# Patient Record
Sex: Female | Born: 1985 | Race: White | Hispanic: No | Marital: Single | Smoking: Never smoker
Health system: Southern US, Community
[De-identification: ages and names within clinical notes are randomized; demographics above are authoritative.]

## PROBLEM LIST (undated history)

## (undated) DIAGNOSIS — Z789 Other specified health status: Secondary | ICD-10-CM

## (undated) HISTORY — PX: NO PAST SURGERIES: SHX2092

---

## 2012-06-26 ENCOUNTER — Emergency Department (HOSPITAL_COMMUNITY)
Admission: EM | Admit: 2012-06-26 | Discharge: 2012-06-26 | Disposition: A | Discharge status: Home / Self Care | Payer: Self-pay | Attending: Emergency Medicine | Admitting: Emergency Medicine

## 2012-06-26 ENCOUNTER — Encounter (HOSPITAL_COMMUNITY): Payer: Self-pay | Admitting: Emergency Medicine

## 2012-06-26 DIAGNOSIS — R338 Other retention of urine: Secondary | ICD-10-CM | POA: Insufficient documentation

## 2012-06-26 DIAGNOSIS — Z87891 Personal history of nicotine dependence: Secondary | ICD-10-CM | POA: Insufficient documentation

## 2012-06-26 LAB — URINALYSIS, MICROSCOPIC ONLY
Leukocytes, UA: NEGATIVE
Nitrite: NEGATIVE
Specific Gravity, Urine: 1.021 (ref 1.005–1.030)
pH: 7 (ref 5.0–8.0)

## 2012-06-26 NOTE — ED Notes (Signed)
Pt here for unable to void x8.5 hr after having sex

## 2012-06-26 NOTE — ED Provider Notes (Signed)
History     CSN: [id number]  Arrival date & time 06/26/12  1754   First MD Initiated Contact with Patient 06/26/12 2108      Chief Complaint  Patient presents with  . Dysuria    (Consider location/radiation/quality/duration/timing/severity/associated sxs/prior treatment) Patient is a 27 y.o. female presenting with dysuria. The history is provided by the patient.  Dysuria   She had sexual intercourse this morning. Following that, she has been unable to void. The last time she was able to urinate was approximately 10 AM. She has had the urge to urinate but has not been able to do so. She denies abdominal pain or flank pain. She denies fever or chills. She's not had urinary problems before this.  History reviewed. No pertinent past medical history.  History reviewed. No pertinent past surgical history.  No family history on file.  History  Substance Use Topics  . Smoking status: Former Games developer  . Smokeless tobacco: Not on file  . Alcohol Use: No    OB History   Grav Para Term Preterm Abortions TAB SAB Ect Mult Living                  Review of Systems  Genitourinary: Positive for dysuria.  All other systems reviewed and are negative.    Allergies  Review of patient's allergies indicates not on file.  Home Medications  No current outpatient prescriptions on file.  BP 159/96  Pulse 74  Temp(Src) 98.4 F (36.9 C)  SpO2 100%  LMP 06/24/2012  Physical Exam  Nursing note and vitals reviewed.  27 year old female, resting comfortably and in no acute distress. Vital signs are significant for hypertension with blood pressure 159/96. Oxygen saturation is 100%, which is normal. Head is normocephalic and atraumatic. PERRLA, EOMI. Oropharynx is clear. Neck is nontender and supple without adenopathy or JVD. Back is nontender and there is no CVA tenderness. Lungs are clear without rales, wheezes, or rhonchi. Chest is nontender. Heart has regular rate and rhythm without  murmur. Abdomen is soft, flat, nontender without hepatosplenomegaly and peristalsis is normoactive. Bladder is moderately distended and nontender, although, she states it is uncomfortable when pressing on the bladder. Extremities have no cyanosis or edema, full range of motion is present. Skin is warm and dry without rash. Neurologic: Mental status is normal, cranial nerves are intact, there are no motor or sensory deficits.  ED Course  Procedures (including critical care time)  Results for orders placed during the hospital encounter of 06/26/12  URINALYSIS, MICROSCOPIC ONLY      Result Value Range   Color, Urine YELLOW  YELLOW   APPearance CLEAR  CLEAR   Specific Gravity, Urine 1.021  1.005 - 1.030   pH 7.0  5.0 - 8.0   Glucose, UA NEGATIVE  NEGATIVE mg/dL   Hgb urine dipstick NEGATIVE  NEGATIVE   Bilirubin Urine NEGATIVE  NEGATIVE   Ketones, ur NEGATIVE  NEGATIVE mg/dL   Protein, ur NEGATIVE  NEGATIVE mg/dL   Urobilinogen, UA 0.2  0.0 - 1.0 mg/dL   Nitrite NEGATIVE  NEGATIVE   Leukocytes, UA NEGATIVE  NEGATIVE   WBC, UA 0-2  <3 WBC/hpf   Squamous Epithelial / LPF RARE  RARE   Urine-Other AMORPHOUS URATES/PHOSPHATES      1. Acute urinary retention       MDM  Acute urinary retention. Bladder scan showed 540 mL in the bladder. She is not on any medications which would depress bladder function. She will  have her bladder emptied by a straight catheterization and then discharged.    Dione Booze, MD 06/26/12 2227

## 2012-09-17 ENCOUNTER — Ambulatory Visit: Payer: BC Managed Care – PPO | Admitting: Physician Assistant

## 2012-09-17 VITALS — BP 123/72 | HR 73 | Temp 98.0°F | Resp 16 | Ht 64.5 in | Wt 166.0 lb

## 2012-09-17 DIAGNOSIS — J309 Allergic rhinitis, unspecified: Secondary | ICD-10-CM

## 2012-09-17 DIAGNOSIS — H6981 Other specified disorders of Eustachian tube, right ear: Secondary | ICD-10-CM

## 2012-09-17 DIAGNOSIS — H6980 Other specified disorders of Eustachian tube, unspecified ear: Secondary | ICD-10-CM

## 2012-09-17 DIAGNOSIS — H9209 Otalgia, unspecified ear: Secondary | ICD-10-CM

## 2012-09-17 DIAGNOSIS — H9201 Otalgia, right ear: Secondary | ICD-10-CM

## 2012-09-17 MED ORDER — IPRATROPIUM BROMIDE 0.03 % NA SOLN: 2.0000 | Freq: Two times a day (BID) | NASAL | Status: DC

## 2012-09-17 MED ORDER — AMOXICILLIN 875 MG PO TABS: 875.0000 mg | ORAL_TABLET | Freq: Two times a day (BID) | ORAL | Status: DC

## 2012-09-17 NOTE — Progress Notes (Signed)
  Subjective:    Patient ID: Barbara Stevens, female    DOB: Feb 19, 1986, 27 y.o.   MRN: [id number]  HPI This 27 y.o. female presents for evaluation of RIGHT ear pain. Began 1 week ago after swimming [place] lake. She's used multiple products in the ear without relief.  Reduced hearing.  The outer ear is not painful, but the RIGHT jaw and face are.  Has developed some nasal congestion and drainage, no sore throat or cough. No fever or chills.  No GI/GU symptoms.  Just completed a Master's Degree in Dance Education at [employer].  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  Past medical history, surgical history, family history, social history and problem list reviewed.  Review of Systems As above.    Objective:   Physical Exam Blood pressure 123/72, pulse 73, temperature 98 F (36.7 C), temperature source Oral, resp. rate 16, height 5' 4.5" (1.638 m), weight 166 lb (75.297 kg), SpO2 100.00%. Body mass index is 28.06 kg/(m^2). Well-developed, well nourished WF who is awake, alert and oriented, in NAD. HEENT: [place]/AT, PERRL, EOMI.  Sclera and conjunctiva are clear.  EAC are patent, TMs are normal in appearance, thought the RIGHT TM is slightly obscured at the base due to some soft cerumen. Nasal mucosa is pink and moist. OP is clear. Neck: supple, non-tender, no lymphadenopathy, thyromegaly. Heart: RRR, no murmur Lungs: normal effort, CTA Abdomen: normo-active bowel sounds, supple, non-tender, no mass or organomegaly. Extremities: no cyanosis, clubbing or edema. Skin: warm and dry without rash. Psychologic: good mood and appropriate affect, normal speech and behavior.      Assessment & Plan:  Ear pain, right - cannot exclude AOM - Plan: amoxicillin (AMOXIL) 875 MG tablet  Allergic rhinitis/ ETD (eustachian tube dysfunction), right - Plan: ipratropium (ATROVENT) 0.03 % nasal spray  Fernande Bras, PA-C Physician Assistant-Certified [facility]

## 2012-09-17 NOTE — Patient Instructions (Signed)
Get plenty of rest and drink at least 64 ounces of water daily. 

## 2014-07-23 ENCOUNTER — Emergency Department (HOSPITAL_COMMUNITY): Payer: BC Managed Care – PPO

## 2014-07-23 ENCOUNTER — Emergency Department (HOSPITAL_COMMUNITY)
Admission: EM | Admit: 2014-07-23 | Discharge: 2014-07-23 | Disposition: A | Discharge status: Home / Self Care | Payer: BC Managed Care – PPO | Attending: Emergency Medicine | Admitting: Emergency Medicine

## 2014-07-23 ENCOUNTER — Encounter (HOSPITAL_COMMUNITY): Payer: Self-pay | Admitting: Emergency Medicine

## 2014-07-23 DIAGNOSIS — Z791 Long term (current) use of non-steroidal anti-inflammatories (NSAID): Secondary | ICD-10-CM | POA: Insufficient documentation

## 2014-07-23 DIAGNOSIS — R1031 Right lower quadrant pain: Secondary | ICD-10-CM | POA: Diagnosis not present

## 2014-07-23 DIAGNOSIS — Z3202 Encounter for pregnancy test, result negative: Secondary | ICD-10-CM | POA: Diagnosis not present

## 2014-07-23 DIAGNOSIS — R112 Nausea with vomiting, unspecified: Secondary | ICD-10-CM

## 2014-07-23 DIAGNOSIS — Z792 Long term (current) use of antibiotics: Secondary | ICD-10-CM | POA: Insufficient documentation

## 2014-07-23 LAB — URINALYSIS, ROUTINE W REFLEX MICROSCOPIC
Bilirubin Urine: NEGATIVE
Glucose, UA: NEGATIVE mg/dL
HGB URINE DIPSTICK: NEGATIVE
Ketones, ur: NEGATIVE mg/dL
LEUKOCYTES UA: NEGATIVE
NITRITE: NEGATIVE
PROTEIN: NEGATIVE mg/dL
SPECIFIC GRAVITY, URINE: 1.025 (ref 1.005–1.030)
UROBILINOGEN UA: 0.2 mg/dL (ref 0.0–1.0)
pH: 5.5 (ref 5.0–8.0)

## 2014-07-23 LAB — BASIC METABOLIC PANEL
ANION GAP: 7 (ref 5–15)
BUN: 10 mg/dL (ref 6–23)
CALCIUM: 9.2 mg/dL (ref 8.4–10.5)
CHLORIDE: 109 mmol/L (ref 96–112)
CO2: 26 mmol/L (ref 19–32)
CREATININE: 0.72 mg/dL (ref 0.50–1.10)
Glucose, Bld: 111 mg/dL — ABNORMAL HIGH (ref 70–99)
Potassium: 3.5 mmol/L (ref 3.5–5.1)
Sodium: 142 mmol/L (ref 135–145)

## 2014-07-23 LAB — CBC WITH DIFFERENTIAL/PLATELET
BASOS PCT: 0 % (ref 0–1)
Basophils Absolute: 0 10*3/uL (ref 0.0–0.1)
EOS ABS: 0.2 10*3/uL (ref 0.0–0.7)
Eosinophils Relative: 2 % (ref 0–5)
HCT: 38.8 % (ref 36.0–46.0)
Hemoglobin: 13.1 g/dL (ref 12.0–15.0)
Lymphocytes Relative: 35 % (ref 12–46)
Lymphs Abs: 2.5 10*3/uL (ref 0.7–4.0)
MCH: 28.2 pg (ref 26.0–34.0)
MCHC: 33.8 g/dL (ref 30.0–36.0)
MCV: 83.6 fL (ref 78.0–100.0)
Monocytes Absolute: 0.4 10*3/uL (ref 0.1–1.0)
Monocytes Relative: 6 % (ref 3–12)
NEUTROS PCT: 57 % (ref 43–77)
Neutro Abs: 4.1 10*3/uL (ref 1.7–7.7)
PLATELETS: 247 10*3/uL (ref 150–400)
RBC: 4.64 MIL/uL (ref 3.87–5.11)
RDW: 12.5 % (ref 11.5–15.5)
WBC: 7.2 10*3/uL (ref 4.0–10.5)

## 2014-07-23 LAB — PREGNANCY, URINE: PREG TEST UR: NEGATIVE

## 2014-07-23 MED ORDER — ONDANSETRON HCL 4 MG/2ML IJ SOLN
4.0000 mg | Freq: Once | INTRAMUSCULAR | Status: AC
  Administered 2014-07-23: 4 mg via INTRAVENOUS
  Filled 2014-07-23: qty 2

## 2014-07-23 MED ORDER — PROMETHAZINE HCL 25 MG/ML IJ SOLN
25.0000 mg | Freq: Once | INTRAMUSCULAR | Status: AC
  Administered 2014-07-23: 25 mg via INTRAVENOUS
  Filled 2014-07-23: qty 1

## 2014-07-23 MED ORDER — IOHEXOL 300 MG/ML  SOLN
50.0000 mL | Freq: Once | INTRAMUSCULAR | Status: AC | PRN
  Administered 2014-07-23: 50 mL via ORAL

## 2014-07-23 MED ORDER — PROMETHAZINE HCL 25 MG PO TABS: 25.0000 mg | ORAL_TABLET | Freq: Four times a day (QID) | ORAL | Status: DC | PRN

## 2014-07-23 MED ORDER — HYDROCODONE-ACETAMINOPHEN 5-325 MG PO TABS: 1.0000 | ORAL_TABLET | Freq: Four times a day (QID) | ORAL | Status: DC | PRN

## 2014-07-23 MED ORDER — MORPHINE SULFATE 4 MG/ML IJ SOLN
4.0000 mg | Freq: Once | INTRAMUSCULAR | Status: AC
  Administered 2014-07-23: 4 mg via INTRAVENOUS
  Filled 2014-07-23: qty 1

## 2014-07-23 MED ORDER — NAPROXEN 500 MG PO TABS: 500.0000 mg | ORAL_TABLET | Freq: Two times a day (BID) | ORAL | Status: DC | PRN

## 2014-07-23 MED ORDER — IOHEXOL 300 MG/ML  SOLN
100.0000 mL | Freq: Once | INTRAMUSCULAR | Status: AC | PRN
  Administered 2014-07-23: 100 mL via INTRAVENOUS

## 2014-07-23 NOTE — ED Notes (Signed)
Patient c/o RLQ abd pain with rebound tenderness, states she felt like there was a "pimple" at the site where it hurts but she is unable to see anything. Last BM @ 12 hours ago, small and hard, states it is unusual for her to go this long without a BM but states she has not been eating as much. Patient c/o severe nausea without diarrhea.

## 2014-07-23 NOTE — ED Provider Notes (Signed)
Care assumed from [facility] PA-C at shift change, pt here with RLQ abd pain, labs and CT pending, r/o appy.   Physical Exam  BP 124/52 mmHg  Pulse 75  Temp(Src) 98.5 F (36.9 C) (Oral)  Resp 19  Ht 5' 4.5" (1.638 m)  Wt 170 lb (77.111 kg)  BMI 28.74 kg/m2  SpO2 100%  LMP 07/16/2014 (Approximate)  Physical Exam Gen: afebrile, VSS, NAD HEENT: EOMI, MMM Resp: no resp distress CV: rate WNL Abd: appearance normal, nondistended, mildly tender in RLQ, no rebound/guarding/rigidity MsK: moving all extremities with ease Neuro: A&O x4  ED Course  Procedures Results for orders placed or performed during the hospital encounter of 07/23/14  Basic metabolic panel  Result Value Ref Range   Sodium 142 135 - 145 mmol/L   Potassium 3.5 3.5 - 5.1 mmol/L   Chloride 109 96 - 112 mmol/L   CO2 26 19 - 32 mmol/L   Glucose, Bld 111 (H) 70 - 99 mg/dL   BUN 10 6 - 23 mg/dL   Creatinine, Ser 1.61 0.50 - 1.10 mg/dL   Calcium 9.2 8.4 - 09.6 mg/dL   GFR calc non Af Amer >90 >90 mL/min   GFR calc Af Amer >90 >90 mL/min   Anion gap 7 5 - 15  CBC WITH DIFFERENTIAL  Result Value Ref Range   WBC 7.2 4.0 - 10.5 K/uL   RBC 4.64 3.87 - 5.11 MIL/uL   Hemoglobin 13.1 12.0 - 15.0 g/dL   HCT 04.5 40.9 - 81.1 %   MCV 83.6 78.0 - 100.0 fL   MCH 28.2 26.0 - 34.0 pg   MCHC 33.8 30.0 - 36.0 g/dL   RDW 91.4 78.2 - 95.6 %   Platelets 247 150 - 400 K/uL   Neutrophils Relative % 57 43 - 77 %   Neutro Abs 4.1 1.7 - 7.7 K/uL   Lymphocytes Relative 35 12 - 46 %   Lymphs Abs 2.5 0.7 - 4.0 K/uL   Monocytes Relative 6 3 - 12 %   Monocytes Absolute 0.4 0.1 - 1.0 K/uL   Eosinophils Relative 2 0 - 5 %   Eosinophils Absolute 0.2 0.0 - 0.7 K/uL   Basophils Relative 0 0 - 1 %   Basophils Absolute 0.0 0.0 - 0.1 K/uL  Urinalysis with microscopic  Result Value Ref Range   Color, Urine YELLOW YELLOW   APPearance CLEAR CLEAR   Specific Gravity, Urine 1.025 1.005 - 1.030   pH 5.5 5.0 - 8.0   Glucose, UA NEGATIVE  NEGATIVE mg/dL   Hgb urine dipstick NEGATIVE NEGATIVE   Bilirubin Urine NEGATIVE NEGATIVE   Ketones, ur NEGATIVE NEGATIVE mg/dL   Protein, ur NEGATIVE NEGATIVE mg/dL   Urobilinogen, UA 0.2 0.0 - 1.0 mg/dL   Nitrite NEGATIVE NEGATIVE   Leukocytes, UA NEGATIVE NEGATIVE  Pregnancy, urine  Result Value Ref Range   Preg Test, Ur NEGATIVE NEGATIVE   Ct Abdomen Pelvis W Contrast  07/23/2014   CLINICAL DATA:  Right lower quadrant pain with rebound tenderness  EXAM: CT ABDOMEN AND PELVIS WITH CONTRAST  TECHNIQUE: Multidetector CT imaging of the abdomen and pelvis was performed using the standard protocol following bolus administration of intravenous contrast. Oral contrast was also administered.  CONTRAST:  OMNIPAQUE IOHEXOL 300 MG/ML  SOLN  COMPARISON:  January 23, 2013  FINDINGS: Lung bases are clear.  Liver is prominent, measuring 18.2 cm in length. No focal liver lesions are identified. Gallbladder wall is not thickened. There is no  appreciable biliary duct dilatation.  Spleen, pancreas, and adrenals appear normal. Kidneys bilaterally show no appreciable mass or hydronephrosis on either side. There is no renal or ureteral calculus on either side.  In the pelvis, the urinary bladder is midline with normal wall thickness. There is no pelvic mass or pelvic fluid collection.  The appendix appears normal.  The terminal ileum appears normal.  There is no bowel obstruction. No free air or portal venous air. There is no appreciable ascites, adenopathy, or abscess in the abdomen or pelvis. There is no abdominal aortic aneurysm. There are no blastic or lytic bone lesions.  IMPRESSION: Prominent liver without focal lesion.  Appendix appears normal. No bowel obstruction. No abscess. No renal or ureteral calculus. No hydronephrosis.   Electronically Signed   By: Bretta Bang III M.D.   On: 07/23/2014 07:04     Meds ordered this encounter  Medications  . ondansetron (ZOFRAN) injection 4 mg    Sig:   .  morphine 4 MG/ML injection 4 mg    Sig:   . iohexol (OMNIPAQUE) 300 MG/ML solution 50 mL    Sig:   . iohexol (OMNIPAQUE) 300 MG/ML solution 100 mL    Sig:   . promethazine (PHENERGAN) injection 25 mg    Sig:   . promethazine (PHENERGAN) 25 MG tablet    Sig: Take 1 tablet (25 mg total) by mouth every 6 (six) hours as needed for nausea or vomiting.    Dispense:  10 tablet    Refill:  0    Order Specific Question:  Supervising Provider    Answer:  Hyacinth Meeker, BRIAN [[id number]]  . HYDROcodone-acetaminophen (NORCO) 5-325 MG per tablet    Sig: Take 1 tablet by mouth every 6 (six) hours as needed for severe pain.    Dispense:  6 tablet    Refill:  0    Order Specific Question:  Supervising Provider    Answer:  MILLER, BRIAN [[id number]]  . naproxen (NAPROSYN) 500 MG tablet    Sig: Take 1 tablet (500 mg total) by mouth 2 (two) times daily as needed for mild pain, moderate pain or headache (TAKE WITH MEALS.).    Dispense:  20 tablet    Refill:  0    Order Specific Question:  Supervising Provider    Answer:  Eber Hong [[id number]]     MDM:   ICD-9-CM ICD-10-CM   1. RLQ abdominal pain 789.03 R10.31   2. Non-intractable vomiting with nausea, vomiting of unspecified type 787.01 R11.2      7:26 AM- BMP WNL, CBC WNL, U/A clear, Upreg neg. CT abd/pelvis with no findings to explain her RLQ abd pain, appendix WNL. Pt still with some RLQ abd tenderness, but mild without peritoneal signs. Still nauseated, will give phenergan then reassess. Pt declines more pain medications. She also reports that last year she had similar pain in the LLQ that was the same, states she had "an infection" but does not recall what she was diagnosed with. Will need referral to PCP.   8:10 AM- tolerating PO well. Will send home with phenergan and pain meds. Will have her f/up with a PCP from resource guide in 1wk. I explained the diagnosis and have given explicit precautions to return to the ER including for any other new or worsening  symptoms. The patient understands and accepts the medical plan as it's been dictated and I have answered their questions. Discharge instructions concerning home care and prescriptions have been given. The patient is  STABLE and is discharged to home in good condition.  BP 136/69 mmHg  Pulse 88  Temp(Src) 98.3 F (36.8 C) (Oral)  Resp 18  Ht 5' 4.5" (1.638 m)  Wt 170 lb (77.111 kg)  BMI 28.74 kg/m2  SpO2 100%  LMP 07/16/2014 (Approximate)   BrittMercedes Camprubi-Soms, PA-C 07/23/14 [id number]  Azalia BilisKevin Campos, MD 07/25/14 [id number]

## 2014-07-23 NOTE — ED Provider Notes (Signed)
CSN: [id number]     Arrival date & time 07/23/14  0423 History   First MD Initiated Contact with Patient 07/23/14 [phone]     Chief Complaint  Patient presents with  . Abdominal Pain    RLQ, rebound tenderness     (Consider location/radiation/quality/duration/timing/severity/associated sxs/prior Treatment) Patient is a 29 y.o. female presenting with abdominal pain. The history is provided by the patient. No language interpreter was used.  Abdominal Pain Pain location:  RLQ Pain severity:  Severe Duration:  1 day Timing:  Constant Progression:  Worsening Associated symptoms: no chills and no fever   Associated symptoms comment:  Pain localized to RLQ abdomen since yesterday morning. She reports nausea, vomiting, anorexia, constipation. No fever. She denies dysuria, vaginal discharge, pelvic pain or irregular menses. No previous abdominal surgeries.    History reviewed. No pertinent past medical history. History reviewed. No pertinent past surgical history. No family history on file. History  Substance Use Topics  . Smoking status: Never Smoker   . Smokeless tobacco: Not on file  . Alcohol Use: No     Comment: occ   OB History    No data available     Review of Systems  Constitutional: Negative for fever and chills.  Respiratory: Negative.   Cardiovascular: Negative.   Gastrointestinal: Positive for abdominal pain.  Musculoskeletal: Negative.   Skin: Negative.   Neurological: Negative.       Allergies  Review of patient's allergies indicates no known allergies.  Home Medications   Prior to Admission medications   Medication Sig Start Date End Date Taking? Authorizing Provider  Flaxseed, Linseed, (FLAX SEED OIL) 1000 MG CAPS Take 1,000 mg by mouth daily.    Yes Historical Provider, MD  Multiple Vitamin (MULTIVITAMIN WITH MINERALS) TABS Take 1 tablet by mouth daily.   Yes Historical Provider, MD  omega-3 acid ethyl esters (LOVAZA) 1 G capsule Take 1 g by mouth daily.     Yes Historical Provider, MD  amoxicillin (AMOXIL) 875 MG tablet Take 1 tablet (875 mg total) by mouth 2 (two) times daily. Patient not taking: Reported on 07/23/2014 09/17/12   Chelle S Jeffery, PA-C  ipratropium (ATROVENT) 0.03 % nasal spray Place 2 sprays into the nose 2 (two) times daily. Patient not taking: Reported on 07/23/2014 09/17/12   Chelle S Jeffery, PA-C  naproxen sodium (ANAPROX) 220 MG tablet Take 220 mg by mouth 2 (two) times daily with a meal.    Historical Provider, MD   BP 137/76 mmHg  Pulse 98  Temp(Src) 97.4 F (36.3 C) (Oral)  Resp 20  Ht 5' 4.5" (1.638 m)  Wt 170 lb (77.111 kg)  BMI 28.74 kg/m2  SpO2 100%  LMP 07/16/2014 (Approximate) Physical Exam  Constitutional: She is oriented to person, place, and time. She appears well-developed and well-nourished.  HENT:  Head: Normocephalic.  Neck: Normal range of motion. Neck supple.  Cardiovascular: Normal rate and regular rhythm.   Pulmonary/Chest: Effort normal and breath sounds normal.  Abdominal: Soft. There is tenderness. There is guarding. There is no rebound.  RLQ tenderness with guarding.   Musculoskeletal: Normal range of motion.  Neurological: She is alert and oriented to person, place, and time.  Skin: Skin is warm and dry. No rash noted.  Psychiatric: She has a normal mood and affect.    ED Course  Procedures (including critical care time) Labs Review Labs Reviewed  BASIC METABOLIC PANEL  CBC WITH DIFFERENTIAL/PLATELET  URINALYSIS, ROUTINE W REFLEX MICROSCOPIC  PREGNANCY, URINE  POC URINE PREG, ED    Imaging Review No results found.   EKG Interpretation None      MDM   Final diagnoses:  None    1. Abdominal pain  Concerning exam/history for appendicitis. CT pending. [facility] PA-C for re-evaluation after CT resulted.     Elpidio AnisShari Marlia Schewe, PA-C 07/23/14 [id number]  Azalia BilisKevin Campos, MD 07/23/14 0630

## 2014-07-23 NOTE — Discharge Instructions (Signed)
Abdominal (belly) pain can be caused by many things. Your caregiver performed an examination and possibly ordered blood/urine tests and imaging (CT scan, x-rays, ultrasound). Many cases can be observed and treated at home after initial evaluation in the emergency department. Even though you are being discharged home, abdominal pain can be unpredictable. Therefore, you need a repeated exam if your pain does not resolve, returns, or worsens. Most patients with abdominal pain don't have to be admitted to the hospital or have surgery, but serious problems like appendicitis and gallbladder attacks can start out as nonspecific pain. Many abdominal conditions cannot be diagnosed in one visit, so follow-up evaluations are very important.  Use phenergan as directed for nausea, and use naprosyn or norco as directed as needed for pain but don't drive or operate heavy machinery with norco use. Follow up with a primary doctor using the list below. Return to the ER for changes or worsening symptoms.  SEEK IMMEDIATE MEDICAL ATTENTION IF YOU DEVELOP ANY OF THE FOLLOWING SYMPTOMS:  The pain does not go away or becomes severe.   A temperature above 101 develops.   Repeated vomiting occurs (multiple episodes).   The pain becomes localized to portions of the abdomen. The right side could possibly be appendicitis. In an adult, the left lower portion of the abdomen could be colitis or diverticulitis.   Blood is being passed in stools or vomit (bright red or black tarry stools).   Return also if you develop chest pain, difficulty breathing, dizziness or fainting, or become confused, poorly responsive, or inconsolable (young children).  The constipation stays for more than 4 days.   There is belly (abdominal) or rectal pain.   You do not seem to be getting better.     Abdominal Pain, Women Abdominal (stomach, pelvic, or belly) pain can be caused by many things. It is important to tell your doctor:  The location  of the pain.  Does it come and go or is it present all the time?  Are there things that start the pain (eating certain foods, exercise)?  Are there other symptoms associated with the pain (fever, nausea, vomiting, diarrhea)? All of this is helpful to know when trying to find the cause of the pain. CAUSES   Stomach: virus or bacteria infection, or ulcer.  Intestine: appendicitis (inflamed appendix), regional ileitis (Crohn's disease), ulcerative colitis (inflamed colon), irritable bowel syndrome, diverticulitis (inflamed diverticulum of the colon), or cancer of the stomach or intestine.  Gallbladder disease or stones in the gallbladder.  Kidney disease, kidney stones, or infection.  Pancreas infection or cancer.  Fibromyalgia (pain disorder).  Diseases of the female organs:  Uterus: fibroid (non-cancerous) tumors or infection.  Fallopian tubes: infection or tubal pregnancy.  Ovary: cysts or tumors.  Pelvic adhesions (scar tissue).  Endometriosis (uterus lining tissue growing in the pelvis and on the pelvic organs).  Pelvic congestion syndrome (female organs filling up with blood just before the menstrual period).  Pain with the menstrual period.  Pain with ovulation (producing an egg).  Pain with an IUD (intrauterine device, birth control) in the uterus.  Cancer of the female organs.  Functional pain (pain not caused by a disease, may improve without treatment).  Psychological pain.  Depression. DIAGNOSIS  Your doctor will decide the seriousness of your pain by doing an examination.  Blood tests.  X-rays.  Ultrasound.  CT scan (computed tomography, special type of X-ray).  MRI (magnetic resonance imaging).  Cultures, for infection.  Barium enema (dye inserted  in the large intestine, to better view it with X-rays).  Colonoscopy (looking in intestine with a lighted tube).  Laparoscopy (minor surgery, looking in abdomen with a lighted tube).  Major  abdominal exploratory surgery (looking in abdomen with a large incision). TREATMENT  The treatment will depend on the cause of the pain.   Many cases can be observed and treated at home.  Over-the-counter medicines recommended by your caregiver.  Prescription medicine.  Antibiotics, for infection.  Birth control pills, for painful periods or for ovulation pain.  Hormone treatment, for endometriosis.  Nerve blocking injections.  Physical therapy.  Antidepressants.  Counseling with a psychologist or psychiatrist.  Minor or major surgery. HOME CARE INSTRUCTIONS   Do not take laxatives, unless directed by your caregiver.  Take over-the-counter pain medicine only if ordered by your caregiver. Do not take aspirin because it can cause an upset stomach or bleeding.  Try a clear liquid diet (broth or water) as ordered by your caregiver. Slowly move to a bland diet, as tolerated, if the pain is related to the stomach or intestine.  Have a thermometer and take your temperature several times a day, and record it.  Bed rest and sleep, if it helps the pain.  Avoid sexual intercourse, if it causes pain.  Avoid stressful situations.  Keep your follow-up appointments and tests, as your caregiver orders.  If the pain does not go away with medicine or surgery, you may try:  Acupuncture.  Relaxation exercises (yoga, meditation).  Group therapy.  Counseling. SEEK MEDICAL CARE IF:   You notice certain foods cause stomach pain.  Your home care treatment is not helping your pain.  You need stronger pain medicine.  You want your IUD removed.  You feel faint or lightheaded.  You develop nausea and vomiting.  You develop a rash.  You are having side effects or an allergy to your medicine. SEEK IMMEDIATE MEDICAL CARE IF:   Your pain does not go away or gets worse.  You have a fever.  Your pain is felt only in portions of the abdomen. The right side could possibly be  appendicitis. The left lower portion of the abdomen could be colitis or diverticulitis.  You are passing blood in your stools (bright red or black tarry stools, with or without vomiting).  You have blood in your urine.  You develop chills, with or without a fever.  You pass out. MAKE SURE YOU:   Understand these instructions.  Will watch your condition.  Will get help right away if you are not doing well or get worse. Document Released: 02/01/2007 Document Revised: 08/21/2013 Document Reviewed: 02/21/2009 [facility] Patient Information 2015 [place], [place]. This information is not intended to replace advice given to you by your health care provider. Make sure you discuss any questions you have with your health care provider.  Nausea and Vomiting Nausea is a sick feeling that often comes before throwing up (vomiting). Vomiting is a reflex where stomach contents come out of your mouth. Vomiting can cause severe loss of body fluids (dehydration). Children and elderly adults can become dehydrated quickly, especially if they also have diarrhea. Nausea and vomiting are symptoms of a condition or disease. It is important to find the cause of your symptoms. CAUSES   Direct irritation of the stomach lining. This irritation can result from increased acid production (gastroesophageal reflux disease), infection, food poisoning, taking certain medicines (such as nonsteroidal anti-inflammatory drugs), alcohol use, or tobacco use.  Signals from the brain.These  signals could be caused by a headache, heat exposure, an inner ear disturbance, increased pressure in the brain from injury, infection, a tumor, or a concussion, pain, emotional stimulus, or metabolic problems.  An obstruction in the gastrointestinal tract (bowel obstruction).  Illnesses such as diabetes, hepatitis, gallbladder problems, appendicitis, kidney problems, cancer, sepsis, atypical symptoms of a heart attack, or eating  disorders.  Medical treatments such as chemotherapy and radiation.  Receiving medicine that makes you sleep (general anesthetic) during surgery. DIAGNOSIS Your caregiver may ask for tests to be done if the problems do not improve after a few days. Tests may also be done if symptoms are severe or if the reason for the nausea and vomiting is not clear. Tests may include:  Urine tests.  Blood tests.  Stool tests.  Cultures (to look for evidence of infection).  X-rays or other imaging studies. Test results can help your caregiver make decisions about treatment or the need for additional tests. TREATMENT You need to stay well hydrated. Drink frequently but in small amounts.You may wish to drink water, sports drinks, clear broth, or eat frozen ice pops or gelatin dessert to help stay hydrated.When you eat, eating slowly may help prevent nausea.There are also some antinausea medicines that may help prevent nausea. HOME CARE INSTRUCTIONS   Take all medicine as directed by your caregiver.  If you do not have an appetite, do not force yourself to eat. However, you must continue to drink fluids.  If you have an appetite, eat a normal diet unless your caregiver tells you differently.  Eat a variety of complex carbohydrates (rice, wheat, potatoes, bread), lean meats, yogurt, fruits, and vegetables.  Avoid high-fat foods because they are more difficult to digest.  Drink enough water and fluids to keep your urine clear or pale yellow.  If you are dehydrated, ask your caregiver for specific rehydration instructions. Signs of dehydration may include:  Severe thirst.  Dry lips and mouth.  Dizziness.  Dark urine.  Decreasing urine frequency and amount.  Confusion.  Rapid breathing or pulse. SEEK IMMEDIATE MEDICAL CARE IF:   You have blood or brown flecks (like coffee grounds) in your vomit.  You have black or bloody stools.  You have a severe headache or stiff neck.  You are  confused.  You have severe abdominal pain.  You have chest pain or trouble breathing.  You do not urinate at least once every 8 hours.  You develop cold or clammy skin.  You continue to vomit for longer than 24 to 48 hours.  You have a fever. MAKE SURE YOU:   Understand these instructions.  Will watch your condition.  Will get help right away if you are not doing well or get worse. Document Released: 04/06/2005 Document Revised: 06/29/2011 Document Reviewed: 09/03/2010 [facility] Patient Information 2015 [place], [place]. This information is not intended to replace advice given to you by your health care provider. Make sure you discuss any questions you have with your health care provider.  Emergency Department Resource Guide 1) Find a Doctor and Pay Out of Pocket Although you won't have to find out who is covered by your insurance plan, it is a good idea to ask around and get recommendations. You will then need to call the office and see if the doctor you have chosen will accept you as a new patient and what types of options they offer for patients who are self-pay. Some doctors offer discounts or will set up payment plans for their  patients who do not have insurance, but you will need to ask so you aren't surprised when you get to your appointment.  2) Contact Your Local Health Department Not all health departments have doctors that can see patients for sick visits, but many do, so it is worth a call to see if yours does. If you don't know where your local health department is, you can check in your phone book. The CDC also has a tool to help you locate your state's health department, and many state websites also have listings of all of their local health departments.  3) Find a Walk-in Clinic If your illness is not likely to be very severe or complicated, you may want to try a walk in clinic. These are popping up all over the country in pharmacies, drugstores, and shopping centers.  They're usually staffed by nurse practitioners or physician assistants that have been trained to treat common illnesses and complaints. They're usually fairly quick and inexpensive. However, if you have serious medical issues or chronic medical problems, these are probably not your best option.  No Primary Care Doctor: - Call Health Connect at  [phone] - they can help you locate a primary care doctor that  accepts your insurance, provides certain services, etc. - Physician Referral Service- [phone]  Chronic Pain Problems: Organization         Address  Phone   Notes  [facility]  [phone] Patients need to be referred by their primary care doctor.   Medication Assistance: Organization         Address  Phone   Notes  [facility] Medication [place] [address] [place], [place] [address] [phone] --Must be a resident of [facility] -- Must have NO insurance coverage whatsoever (no Medicaid/ Medicare, etc.) -- The pt. MUST have a primary care doctor that directs their care regularly and follows them in the community   [facility]  [phone]   [employer]  [phone]    Agencies that provide inexpensive medical care: Organization         Address  Phone   Notes  [facility]  [phone]   [facility]    [phone]   [facility] [address], [place] [address] [phone]   [facility] [address] [place], [place] [phone]   [facility]    [phone]   [facility]    [phone]   [facility] and [facility]  [address], [place] Phone:  [phone], Fax:  [phone] Hours of Operation:  9 am - 6 pm, M-F.  Also accepts Medicaid/Medicare and self-pay.  [facility]  [address], [place] Phone: [phone], Fax: [phone]. Hours of Operation:  8:30 am - 5:30 pm, M-F.  Also accepts Medicaid and self-pay.  [facility] [place] [address] [place] Phone: [phone]   [facility] [address] Natasha Bence [place], [place] [phone], Ext. [phone] Mondays & Thursdays: 7-9 AM.  First 15 patients are seen on a first come, first serve basis.    Medicaid-accepting [facility] Providers:  Organization         Address  Phone   Notes  [facility] [address], [address] [phone] Also accepts self-pay patients.  Celesta Gentile  [facility] [address], [place]  [phone]   [facility] [address], [place] [phone]   [facility] [address] [place] [address], [place] [phone]   Renaye RakersVeita Bland [address], [place]   [phone] Only accepts [place] patients after they have their name applied to their card.   Self-Pay (no insurance) in [place]:  Organization         Address  Phone   Notes  Sickle Cell Patients, [facility] [address], [place] [phone]   [facility] [address], [place] [phone]   [facility] [place]  [address], [place] [phone]   [facility]/Dr. Osei-Bonsu  [address] [place] [address], [place] or [phone], [place] [phone] Phone number for both [place] and [place] locations is the same.  [facility] [address], [place] [phone]   [facility] [address] [place] [address], [place] or [address] [phone]   [facility] [address], [place] [phone], phone; [phone], fax Sees patients 1st and 3rd Saturday of every month.  Must not qualify for public or private insurance (i.e.  Medicaid, Medicare,  Health Choice, Veterans' Benefits)  Household income should be no more than 200% of the poverty level The clinic cannot treat you if you are pregnant or think you are pregnant  Sexually transmitted diseases are not treated at the clinic.

## 2014-07-23 NOTE — ED Notes (Signed)
EDPA MERCEDES at bedside. 

## 2014-12-13 LAB — OB RESULTS CONSOLE HEPATITIS B SURFACE ANTIGEN: Hepatitis B Surface Ag: NEGATIVE

## 2014-12-13 LAB — OB RESULTS CONSOLE ABO/RH: RH Type: NEGATIVE

## 2014-12-13 LAB — OB RESULTS CONSOLE RPR: RPR: NONREACTIVE

## 2014-12-13 LAB — OB RESULTS CONSOLE HIV ANTIBODY (ROUTINE TESTING): HIV: NONREACTIVE

## 2014-12-13 LAB — OB RESULTS CONSOLE RUBELLA ANTIBODY, IGM: Rubella: IMMUNE

## 2015-01-04 LAB — OB RESULTS CONSOLE GC/CHLAMYDIA
Chlamydia: NEGATIVE
GC PROBE AMP, GENITAL: NEGATIVE

## 2015-07-20 ENCOUNTER — Encounter (HOSPITAL_COMMUNITY): Payer: Self-pay | Admitting: *Deleted

## 2015-07-20 ENCOUNTER — Inpatient Hospital Stay (HOSPITAL_COMMUNITY)
Admission: AD | Admit: 2015-07-20 | Discharge: 2015-07-24 | DRG: 765 | Disposition: A | Discharge status: Home / Self Care | Payer: BC Managed Care – PPO | Source: Ambulatory Visit | Attending: Obstetrics and Gynecology | Admitting: Obstetrics and Gynecology

## 2015-07-20 DIAGNOSIS — D62 Acute posthemorrhagic anemia: Secondary | ICD-10-CM | POA: Diagnosis not present

## 2015-07-20 DIAGNOSIS — Z6711 Type A blood, Rh negative: Secondary | ICD-10-CM | POA: Diagnosis not present

## 2015-07-20 DIAGNOSIS — O4292 Full-term premature rupture of membranes, unspecified as to length of time between rupture and onset of labor: Principal | ICD-10-CM | POA: Diagnosis present

## 2015-07-20 DIAGNOSIS — Z3A38 38 weeks gestation of pregnancy: Secondary | ICD-10-CM

## 2015-07-20 DIAGNOSIS — O26893 Other specified pregnancy related conditions, third trimester: Secondary | ICD-10-CM | POA: Diagnosis present

## 2015-07-20 DIAGNOSIS — O26899 Other specified pregnancy related conditions, unspecified trimester: Secondary | ICD-10-CM

## 2015-07-20 DIAGNOSIS — Z98891 History of uterine scar from previous surgery: Secondary | ICD-10-CM

## 2015-07-20 DIAGNOSIS — O134 Gestational [pregnancy-induced] hypertension without significant proteinuria, complicating childbirth: Secondary | ICD-10-CM | POA: Diagnosis present

## 2015-07-20 DIAGNOSIS — Z6791 Unspecified blood type, Rh negative: Secondary | ICD-10-CM

## 2015-07-20 DIAGNOSIS — O99824 Streptococcus B carrier state complicating childbirth: Secondary | ICD-10-CM | POA: Diagnosis present

## 2015-07-20 DIAGNOSIS — O9081 Anemia of the puerperium: Secondary | ICD-10-CM | POA: Diagnosis not present

## 2015-07-20 DIAGNOSIS — B951 Streptococcus, group B, as the cause of diseases classified elsewhere: Secondary | ICD-10-CM

## 2015-07-20 DIAGNOSIS — O4290 Premature rupture of membranes, unspecified as to length of time between rupture and onset of labor, unspecified weeks of gestation: Secondary | ICD-10-CM | POA: Diagnosis present

## 2015-07-20 HISTORY — DX: Other specified health status: Z78.9

## 2015-07-20 LAB — ABO/RH: ABO/RH(D): A NEG

## 2015-07-20 LAB — CBC
HEMATOCRIT: 34.9 % — AB (ref 36.0–46.0)
HEMOGLOBIN: 12.3 g/dL (ref 12.0–15.0)
MCH: 28.5 pg (ref 26.0–34.0)
MCHC: 35.2 g/dL (ref 30.0–36.0)
MCV: 81 fL (ref 78.0–100.0)
Platelets: 258 10*3/uL (ref 150–400)
RBC: 4.31 MIL/uL (ref 3.87–5.11)
RDW: 13.3 % (ref 11.5–15.5)
WBC: 9.3 10*3/uL (ref 4.0–10.5)

## 2015-07-20 LAB — COMPREHENSIVE METABOLIC PANEL
ALBUMIN: 3.3 g/dL — AB (ref 3.5–5.0)
ALT: 50 U/L (ref 14–54)
ANION GAP: 9 (ref 5–15)
AST: 47 U/L — ABNORMAL HIGH (ref 15–41)
Alkaline Phosphatase: 143 U/L — ABNORMAL HIGH (ref 38–126)
BILIRUBIN TOTAL: 0.3 mg/dL (ref 0.3–1.2)
BUN: 10 mg/dL (ref 6–20)
CO2: 22 mmol/L (ref 22–32)
Calcium: 8.8 mg/dL — ABNORMAL LOW (ref 8.9–10.3)
Chloride: 104 mmol/L (ref 101–111)
Creatinine, Ser: 0.65 mg/dL (ref 0.44–1.00)
GFR calc Af Amer: >60 mL/min (ref >60)
Glucose, Bld: 112 mg/dL — ABNORMAL HIGH (ref 65–99)
POTASSIUM: 4 mmol/L (ref 3.5–5.1)
Sodium: 135 mmol/L (ref 135–145)
TOTAL PROTEIN: 6.8 g/dL (ref 6.5–8.1)

## 2015-07-20 LAB — PROTEIN / CREATININE RATIO, URINE
CREATININE, URINE: 59 mg/dL
PROTEIN CREATININE RATIO: 0.29 mg/mg{creat} — AB (ref 0.00–0.15)
TOTAL PROTEIN, URINE: 17 mg/dL

## 2015-07-20 LAB — TYPE AND SCREEN
ABO/RH(D): A NEG
ANTIBODY SCREEN: NEGATIVE

## 2015-07-20 LAB — AMNISURE RUPTURE OF MEMBRANE (ROM) NOT AT ARMC: Amnisure ROM: POSITIVE

## 2015-07-20 LAB — OB RESULTS CONSOLE GBS: STREP GROUP B AG: POSITIVE

## 2015-07-20 LAB — URIC ACID: URIC ACID, SERUM: 4.8 mg/dL (ref 2.3–6.6)

## 2015-07-20 LAB — LACTATE DEHYDROGENASE: LDH: 188 U/L (ref 98–192)

## 2015-07-20 MED ORDER — PENICILLIN G POTASSIUM 5000000 UNITS IJ SOLR
2.5000 10*6.[IU] | INTRAVENOUS | Status: DC
  Administered 2015-07-20 – 2015-07-21 (×8): 2.5 10*6.[IU] via INTRAVENOUS
  Filled 2015-07-20 (×10): qty 2.5

## 2015-07-20 MED ORDER — LIDOCAINE HCL (PF) 1 % IJ SOLN: 30.0000 mL | INTRAMUSCULAR | Status: DC | PRN

## 2015-07-20 MED ORDER — LACTATED RINGERS IV SOLN
2.5000 [IU]/h | INTRAVENOUS | Status: DC
  Filled 2015-07-20 (×2): qty 4

## 2015-07-20 MED ORDER — NALBUPHINE HCL 10 MG/ML IJ SOLN: 5.0000 mg | INTRAMUSCULAR | Status: DC | PRN

## 2015-07-20 MED ORDER — SODIUM CHLORIDE 0.9 % IV SOLN: 250.0000 mL | INTRAVENOUS | Status: DC | PRN

## 2015-07-20 MED ORDER — PENICILLIN G POTASSIUM 5000000 UNITS IJ SOLR
5.0000 10*6.[IU] | Freq: Once | INTRAVENOUS | Status: AC
  Administered 2015-07-20: 5 10*6.[IU] via INTRAVENOUS
  Filled 2015-07-20: qty 5

## 2015-07-20 MED ORDER — DEXTROMETHORPHAN POLISTIREX ER 30 MG/5ML PO SUER
60.0000 mg | Freq: Two times a day (BID) | ORAL | Status: DC | PRN
  Administered 2015-07-20: 60 mg via ORAL
  Filled 2015-07-20 (×3): qty 10

## 2015-07-20 MED ORDER — SODIUM CHLORIDE 0.9% FLUSH
3.0000 mL | Freq: Two times a day (BID) | INTRAVENOUS | Status: DC
  Administered 2015-07-20: 3 mL via INTRAVENOUS

## 2015-07-20 MED ORDER — LACTATED RINGERS IV SOLN
500.0000 mL | INTRAVENOUS | Status: DC | PRN
  Administered 2015-07-21: 1000 mL via INTRAVENOUS
  Administered 2015-07-21: 500 mL via INTRAVENOUS

## 2015-07-20 MED ORDER — ACETAMINOPHEN 325 MG PO TABS
650.0000 mg | ORAL_TABLET | ORAL | Status: DC | PRN
  Administered 2015-07-21: 650 mg via ORAL
  Filled 2015-07-20: qty 2

## 2015-07-20 MED ORDER — OXYTOCIN BOLUS FROM INFUSION: 500.0000 mL | INTRAVENOUS | Status: DC

## 2015-07-20 MED ORDER — GUAIFENESIN-DM 100-10 MG/5ML PO SYRP
5.0000 mL | ORAL_SOLUTION | ORAL | Status: DC | PRN
  Administered 2015-07-21 (×4): 5 mL via ORAL
  Filled 2015-07-20 (×5): qty 5

## 2015-07-20 MED ORDER — SODIUM CHLORIDE 0.9% FLUSH
3.0000 mL | INTRAVENOUS | Status: DC | PRN
  Administered 2015-07-20: 3 mL via INTRAVENOUS
  Filled 2015-07-20: qty 3

## 2015-07-20 MED ORDER — OXYCODONE-ACETAMINOPHEN 5-325 MG PO TABS: 1.0000 | ORAL_TABLET | ORAL | Status: DC | PRN

## 2015-07-20 MED ORDER — ONDANSETRON HCL 4 MG/2ML IJ SOLN: 4.0000 mg | Freq: Four times a day (QID) | INTRAMUSCULAR | Status: DC | PRN

## 2015-07-20 MED ORDER — LACTATED RINGERS IV SOLN
INTRAVENOUS | Status: DC
  Administered 2015-07-20 – 2015-07-21 (×3): via INTRAVENOUS

## 2015-07-20 MED ORDER — OXYCODONE-ACETAMINOPHEN 5-325 MG PO TABS: 2.0000 | ORAL_TABLET | ORAL | Status: DC | PRN

## 2015-07-20 MED ORDER — CITRIC ACID-SODIUM CITRATE 334-500 MG/5ML PO SOLN
30.0000 mL | ORAL | Status: DC | PRN
Start: 2015-07-20 — End: 2015-07-21

## 2015-07-20 NOTE — Progress Notes (Addendum)
Assuming care of Barbara Stevens, 30 yo G1P0 @ 38.2 wks admitted for ROM. Spouse at bedside.  Subjective: Denies h/a, visual changes, epigastric pain or difficulty breathing. Endorses FM and strong ctxs, q 4 min x 1 1/2 min when ambulating. Strongly desires WB. Refuses Cytotec & Pitocin due to "ctxs will worsen". Does not want an epidural. C/O persistent cough in spite of Delsym.  Objective: BP 156/89 mmHg  Pulse 82  Temp(Src) 98.5 F (36.9 C) (Oral)  Resp 18  Ht 5\' 4"  (1.626 m)  Wt 101.152 kg (223 lb)  BMI 38.26 kg/m2  LMP 07/16/2014 (Approximate) I/O last 3 completed shifts: In: 60 [P.O.:60] Out: 400 [Urine:400]   Today's Vitals   07/20/15 1856 07/20/15 1939 07/20/15 2137 07/20/15 2207  BP:  136/66 155/66 156/89  Pulse:  76 77 82  Temp:  98.5 F (36.9 C)    TempSrc:  Oral    Resp:  18    Height:      Weight:      PainSc: 4  4      No anti-hypertensive therapy required  Gen: Anxious Lungs: CTAB  CV: RRR w/o M/R/G Abdomen: gravid, soft btw ctxs, EFW: 9 lbs; Cephalic by Leopolds and VE  ? Adequate pelvis FHT: BL 120 w/ moderate variability, +accels, no decels (intermittent monitoring) UC:   irregular, every 2-4 minutes, palpate mild SVE:   Dilation: 1.5 Effacement (%): 80 Station: -3 Exam by:: Barbara Stevens, cnm @ 2239 DTRs 1+ bilaterally, no clonus, trace edema  Assessment:  Prolonged SROM (18 hrs); no concerns for infection - max temp 98.8 Cat 1 FHRT GBS positive (adequately treated) Latent labor Possible LGA infant Rh neg (refused Rhophylac due to spouse w/ same blood type -- A negative) Elevated BMI (38) Persistent dry cough  Plan: Long discussion held w/ couple regarding status -- 18-hr rupture w/ no cervical change, elevated BPs, possible LGA infant, elevated AST and high normal PCR. Explained that based on the elevated BPs, AST and high normal PCR, I do not feel she is a candidate for a WB. Reviewed issue of LGA fetus, with increased risk of shoulder  dystocia, prolonged labor, FTP and chorioamnionitis.Explained that augmentation is needed as she is not in labor in spite of ctxs and SROM. Pt visibly upset and stated that having a WB was approved earlier. Explained that while I want to support the plan that is already in place, my primary concern was maternal/fetal status. Couple seemed to understand my concerns and asked for time to ambulate more, and discuss w/ family. Informed couple that I would return in 2 hrs for re-evaluation -- both in agreement.  Will try guaiFENesin syrup.   Dr. Normand Sloopillard updated.      Sherre ScarletWILLIAMS, Tipton Ballow CNM 07/20/2015, 10:53 PM  ADDENDUM: Back to room. Pt reports pain 10/10. States "I thought I could do this, but I can't -- I want an epidural and I will take Pitocin." Informed that I would wait to check her cvx and start Pitocin until she is comfortable w/ her epidural.   Dr. Normand Sloopillard updated.   Sherre ScarletKimberly Valree Feild, CNM 07/21/15, 00:45 AM  ADDENDUM: Comfortable w/ epidural (placed at 0119). Cvx prior to starting Pitocin = 3/90/-2 per RN @ 0155. Expect further progress.  Sherre ScarletKimberly Sharunda Salmon, CNM 07/21/15, [phone]

## 2015-07-20 NOTE — Progress Notes (Signed)
Labor Progress  Subjective: No complaints, desires a water birth.  Desires no un-necessary interventions. Birth plan reviewed, pt understands that she may need to be a little flexible  Objective: BP 150/84 mmHg  Pulse 92  Temp(Src) 98.6 F (37 C) (Oral)  Resp 19  Ht 5\' 4"  (1.626 m)  Wt 223 lb (101.152 kg)  BMI 38.26 kg/m2  LMP 07/16/2014 (Approximate)   Total I/O In: 60 [P.O.:60] Out: 400 [Urine:400] FHT: 135, moderate variabilty + accel, no decel CTX:  irregular, occasional Uterus gravid, soft non tender SVE:  Dilation: 1 Effacement (%): 60 Station: -3 Exam by:: Barbara Stevens, CNM    Assessment:  IUP at 38.2 weeks NICHD: Category 1 Membranes:  SROM x 9hrs, no s/s of infection  Induction: decline  Pain management: n/a  GBS positive  Abx:1  Plan: Continue labor plan intermittent monitoring Ambulate Frequent position changes to facilitate fetal rotation and descent. Will reassess with cervical exam at 1800 or earlier if necessary      Barbara Stevens, CNM, MSN 07/20/2015. 5:29 PM

## 2015-07-20 NOTE — H&P (Signed)
Barbara Stevens is a 30 y.o. female, G1 P0 at 38.1 weeks  Patient Active Problem List   Diagnosis Date Noted  . PROM (premature rupture of membranes) 07/20/2015    Pregnancy Course: Patient entered care at 10.1 and transferred care at 21.5 weeks.   EDC of 08/02/2015 was established by LMP.      [place] evaluations:  18.6 weeks - Anatomy: AUA 19.6, EFW 11oz - 60%, FHR 154, anterior previa, cervicallength 4.1    27.3 weeks - FU for previa:  Cervical length 4.35, AFI 17.19, FHR 133, EFW 3lb 3oz - 97%, vertex, anterior marginal previa  32.6 weeks - FU for previa and growth: EFW 5lb 9oz -85% , FHR 147,  AFI 14.27, cervical length 4.30, vertex, anterior placenta edge 1.4cm from internal os. 36.6 weeks - FU for previa and growth: EFW 7lb 11oz - 88.9%, AFI 17.16, FHR 153, vertex, anterior placenta edge 5.6cm from internal oz.  Significant prenatal events:    1) AST/ALT 36/26--recheck at next visit 07/18/15 AST 46, ALT 47.     2)False positive RPR - 05/06/15: titers 1: 16, negative reflex confirmatory testing.  Repeat RPR result same 06/03/15, negative confirmatory testing.  3) RhD negative, PATIENT STATES FOB IS BLOOD GRP A NEG TOO, DOES NOT DESIRE RHOGAM  4) LGA [place] 1/16: EFW >97%, 85%ile on 06/12/15 visit. 7+11 ON 07/11/15, 88%ILE.  5) placenta previa, resolved on 07/11/15  Last evaluation:   38.1 weeks    Reason for admission:  PROM  Pt States:   Contractions Frequency: none         Contraction severity: n/a         Fetal activity: +FM  OB History    Gravida Para Term Preterm AB TAB SAB Ectopic Multiple Living   1              Past Medical History  Diagnosis Date  . Medical history non-contributory    Past Surgical History  Procedure Laterality Date  . No past surgeries     Family History: family history is negative for Alcohol abuse, Arthritis, Asthma, Birth defects, Cancer, COPD, Depression, Diabetes, Drug abuse, Early death, Hearing loss, Heart disease, Hyperlipidemia, Hypertension,  Kidney disease, Learning disabilities, Mental illness, Mental retardation, Miscarriages / Stillbirths, Stroke, Vision loss, and Varicose Veins. Social History:  reports that she has never smoked. She does not have any smokeless tobacco history on file. She reports that she does not drink alcohol or use illicit drugs.   Prenatal Transfer Tool  Maternal Diabetes: No Genetic Screening:  Maternal Ultrasounds/Referrals: Normal Fetal Ultrasounds or other Referrals:  None Maternal Substance Abuse:  No Significant Maternal Medications:  None Significant Maternal Lab Results: Lab values include: Group B Strep positive, Rh negative   ROS:  See HPI above, all other systems are negative  No Known Allergies  Dilation: 1 Effacement (%): 70 Station: -3 Exam by:: [employer] RN Blood pressure 142/72, pulse 74, temperature 98.7 F (37.1 C), temperature source Oral, resp. rate 18, height  (1.626 m), weight 223 lb (101.152 kg), last menstrual period 07/16/2014.  Maternal Exam:  Uterine Assessment: Contraction frequency is rare.  Abdomen: Gravid, non tender. Fundal height is aga.  Normal external genitalia, vulva, cervix, uterus and adnexa.  No lesions noted on exam.  Pelvis adequate for delivery.  Fetal presentation: Vertex by VE  Fetal Exam:  Monitor Surveillance :  Intermitting Monitoring  Mode: Ultrasound.  NICHD: Category 1 CTXs: occasional EFW   8 lbs  Physical  Exam: Nursing note and vitals reviewed General: alert and cooperative She appears well nourished Psychiatric: Normal mood and affect. Her behavior is normal Head: Normocephalic Eyes: Pupils are equal, round, and reactive to light Neck: Normal range of motion Cardiovascular: RRR without murmur  Respiratory: CTAB. Effort normal  Abd: soft, non-tender, +BS, no rebound, no guarding  Genitourinary: Vagina normal  Neurological: A&Ox3 Skin: Warm and dry  Musculoskeletal: Normal range of motion  Homan's sign negative  bilaterally No evidence of DVTs.  Edema:Minimal bilaterally non-pitting edema DTR: 2+ Clonus: None   Prenatal labs: ABO, Rh: --/--/A NEG (04/01 0925) Antibody: NEG (04/01 0925) Rubella:  immune RPR:    NR 12/13/15, positive 05/06/15 & 06/03/15.  False positive RPR - 05/06/15: titers 1: 16, negative reflex confirmatory testing.  Repeat RPR result same 06/03/15, negative confirmatory testing. HBsAg:   negative HIV:   NR GBS:  positive Sickle cell/Hgb electrophoresis:  WNL Pap:   GC: negative    Chlamydia: negative Genetic screenings:    Glucola:  wnl  Assessment:  IUP at 38.1 weeks NICHD: Category Membranes: SROM Bishop Score: 1 GBS positive  Results for orders placed or performed during the hospital encounter of 07/20/15 (from the past 24 hour(s))  [facility] (rom[facility]     Status: None   Collection Time: 07/20/15  7:50 AM  Result Value Ref Range   Amnisure ROM POSITIVE   CBC     Status: Abnormal   Collection Time: 07/20/15  9:25 AM  Result Value Ref Range   WBC 9.3 4.0 - 10.5 K/uL   RBC 4.31 3.87 - 5.11 MIL/uL   Hemoglobin 12.3 12.0 - 15.0 g/dL   HCT 30.834.9 (L) 65.736.0 - 84.646.0 %   MCV 81.0 78.0 - 100.0 fL   MCH 28.5 26.0 - 34.0 pg   MCHC 35.2 30.0 - 36.0 g/dL   RDW 96.213.3 95.211.5 - 84.115.5 %   Platelets 258 150 - 400 K/uL  Type and screen [facility] [place]     Status: None   Collection Time: 07/20/15  9:25 AM  Result Value Ref Range   ABO/RH(D) A NEG    Antibody Screen NEG    Sample Expiration 07/23/2015   Protein / creatinine ratio, urine     Status: Abnormal   Collection Time: 07/20/15 10:45 AM  Result Value Ref Range   Creatinine, Urine 59.00 mg/dL   Total Protein, Urine 17 mg/dL   Protein Creatinine Ratio 0.29 (H) 0.00 - 0.15 mg/mg[Cre]  Comprehensive metabolic panel     Status: Abnormal   Collection Time: 07/20/15 10:49 AM  Result Value Ref Range   Sodium 135 135 - 145 mmol/L   Potassium 4.0 3.5 - 5.1 mmol/L   Chloride 104 101 -  111 mmol/L   CO2 22 22 - 32 mmol/L   Glucose, Bld 112 (H) 65 - 99 mg/dL   BUN 10 6 - 20 mg/dL   Creatinine, Ser 3.240.65 0.44 - 1.00 mg/dL   Calcium 8.8 (L) 8.9 - 10.3 mg/dL   Total Protein 6.8 6.5 - 8.1 g/dL   Albumin 3.3 (L) 3.5 - 5.0 g/dL   AST 47 (H) 15 - 41 U/L   ALT 50 14 - 54 U/L   Alkaline Phosphatase 143 (H) 38 - 126 U/L   Total Bilirubin 0.3 0.3 - 1.2 mg/dL   GFR calc non Af Amer >60 >60 mL/min   GFR calc Af Amer >60 >60 mL/min   Anion gap 9 5 -  15  Uric acid     Status: None   Collection Time: 07/20/15 10:49 AM  Result Value Ref Range   Uric Acid, Serum 4.8 2.3 - 6.6 mg/dL  Lactate dehydrogenase     Status: None   Collection Time: 07/20/15 10:49 AM  Result Value Ref Range   LDH 188 98 - 192 U/L     Plan:  Admit to L&D for expectant/active management of labor. Possible augmentation options reviewed including foley bulb, AROM and/or pitocin.   GBS prophylaxis with PCN G per Shakeeta Godette dosing  IV pain medication per orders PRN Epidural per patient request Foley cath after patient is comfortable with epidural Anticipate SVD  Labor mgmt as ordered Desires water birth   Okay to ambulate around unit with wireless monitors  Okay to get up and shower without monitoring   May auscultate FHR intermittently,  if expectant management     q 30 min in active labor - x 5 minutes     q 15 min in transition - before during and after a ctx     q 5 min with pushing - before during and after a ctx.     May ambulate without monitoring.     If no active labor, may do NST q 2 hours.   Attending MD available at all times.    Chenika Nevils, CNM, MSN 07/20/2015, 10:58 AM

## 2015-07-20 NOTE — Consults (Signed)
  Anesthesia Pain Consult Note  Patient: Barbara RotundaSamantha Soave, 30 y.o., female  Consult Requested by: Jaymes GraffNaima Dillard, MD  Reason for Consult: CRNA rounds Pain goal: 8 water birth planned Pain now: 4    Lea Baine 07/20/2015

## 2015-07-20 NOTE — Progress Notes (Signed)
Labor Progress  Subjective: No complaints.  Desire minimum VE.  Want to continue with expectant management and pitocin free so she can have a water birth.  Objective: BP 140/76 mmHg  Pulse 87  Temp(Src) 98.6 F (37 C) (Oral)  Resp 18  Ht 5\' 4"  (1.626 m)  Wt 223 lb (101.152 kg)  BMI 38.26 kg/m2  LMP 07/16/2014 (Approximate)   Total I/O In: 60 [P.O.:60] Out: 400 [Urine:400] FHT: 140, moderate variability, +aacel, no decel CTX:  regular, every 3-4 minutes Uterus gravid, soft non tender SVE:  Dilation: 1.5 Effacement (%): 80 Station: -3 Exam by:: Arin Peral,CNM   Assessment:  IUP at 38.2 weeks Expectant management NICHD: Category 1 Membranes:  SROM x 13.5hrs, no s/s of infection  Induction: declines  Pain management: declines  GBS positive  Abx:2  Plan: Continue labor plan intermittent monitoring Rest Ambulate Frequent position changes to facilitate fetal rotation and descent. Sign out will be given to Sherre ScarletKimberly Williams CNM      Barbara Stevens, CNM, MSN 07/20/2015. 6:19 PM

## 2015-07-20 NOTE — MAU Note (Signed)
C/o ?SROM at 0500 am this AM;

## 2015-07-21 ENCOUNTER — Encounter (HOSPITAL_COMMUNITY)
Admission: AD | Disposition: A | Discharge status: Home / Self Care | Payer: Self-pay | Source: Ambulatory Visit | Attending: Obstetrics and Gynecology

## 2015-07-21 ENCOUNTER — Inpatient Hospital Stay (HOSPITAL_COMMUNITY): Payer: BC Managed Care – PPO | Admitting: Anesthesiology

## 2015-07-21 ENCOUNTER — Encounter (HOSPITAL_COMMUNITY): Payer: Self-pay | Admitting: Anesthesiology

## 2015-07-21 LAB — CBC
HCT: 33.6 % — ABNORMAL LOW (ref 36.0–46.0)
HEMOGLOBIN: 12 g/dL (ref 12.0–15.0)
MCH: 29 pg (ref 26.0–34.0)
MCHC: 35.7 g/dL (ref 30.0–36.0)
MCV: 81.2 fL (ref 78.0–100.0)
Platelets: 283 10*3/uL (ref 150–400)
RBC: 4.14 MIL/uL (ref 3.87–5.11)
RDW: 13.4 % (ref 11.5–15.5)
WBC: 11.7 10*3/uL — AB (ref 4.0–10.5)

## 2015-07-21 LAB — RPR: RPR Ser Ql: REACTIVE — AB

## 2015-07-21 LAB — RPR, QUANT+TP ABS (REFLEX): TREPONEMA PALLIDUM AB: NEGATIVE

## 2015-07-21 SURGERY — Surgical Case
Anesthesia: Epidural

## 2015-07-21 MED ORDER — NALBUPHINE HCL 10 MG/ML IJ SOLN: 5.0000 mg | Freq: Once | INTRAMUSCULAR | Status: DC | PRN

## 2015-07-21 MED ORDER — MENTHOL 3 MG MT LOZG
1.0000 | LOZENGE | OROMUCOSAL | Status: DC | PRN
  Filled 2015-07-21: qty 9

## 2015-07-21 MED ORDER — LACTATED RINGERS IV SOLN
INTRAVENOUS | Status: DC | PRN
  Administered 2015-07-21: 20:00:00 via INTRAVENOUS

## 2015-07-21 MED ORDER — FENTANYL CITRATE (PF) 100 MCG/2ML IJ SOLN
INTRAMUSCULAR | Status: DC | PRN
  Administered 2015-07-21: 100 ug via EPIDURAL

## 2015-07-21 MED ORDER — BUPIVACAINE HCL (PF) 0.5 % IJ SOLN
INTRAMUSCULAR | Status: DC | PRN
  Administered 2015-07-21: 30 mL

## 2015-07-21 MED ORDER — SODIUM BICARBONATE 8.4 % IV SOLN
INTRAVENOUS | Status: AC
  Filled 2015-07-21: qty 50

## 2015-07-21 MED ORDER — FLEET ENEMA 7-19 GM/118ML RE ENEM: 1.0000 | ENEMA | Freq: Every day | RECTAL | Status: DC | PRN

## 2015-07-21 MED ORDER — SODIUM BICARBONATE 8.4 % IV SOLN
INTRAVENOUS | Status: DC | PRN
  Administered 2015-07-21 (×4): 5 mL via EPIDURAL

## 2015-07-21 MED ORDER — NALOXONE HCL 0.4 MG/ML IJ SOLN: 0.4000 mg | INTRAMUSCULAR | Status: DC | PRN

## 2015-07-21 MED ORDER — PHENYLEPHRINE 40 MCG/ML (10ML) SYRINGE FOR IV PUSH (FOR BLOOD PRESSURE SUPPORT)
80.0000 ug | PREFILLED_SYRINGE | INTRAVENOUS | Status: DC | PRN
  Filled 2015-07-21: qty 20

## 2015-07-21 MED ORDER — FERROUS SULFATE 325 (65 FE) MG PO TABS
325.0000 mg | ORAL_TABLET | Freq: Two times a day (BID) | ORAL | Status: DC
Start: 2015-07-22 — End: 2015-07-24
  Administered 2015-07-22 – 2015-07-24 (×5): 325 mg via ORAL
  Filled 2015-07-21 (×5): qty 1

## 2015-07-21 MED ORDER — EPHEDRINE 5 MG/ML INJ: 10.0000 mg | INTRAVENOUS | Status: DC | PRN

## 2015-07-21 MED ORDER — LACTATED RINGERS IV SOLN: INTRAVENOUS | Status: DC

## 2015-07-21 MED ORDER — BUPIVACAINE HCL (PF) 0.5 % IJ SOLN
INTRAMUSCULAR | Status: AC
  Filled 2015-07-21: qty 30

## 2015-07-21 MED ORDER — LIDOCAINE-EPINEPHRINE (PF) 2 %-1:200000 IJ SOLN
INTRAMUSCULAR | Status: AC
  Filled 2015-07-21: qty 20

## 2015-07-21 MED ORDER — TETANUS-DIPHTH-ACELL PERTUSSIS 5-2.5-18.5 LF-MCG/0.5 IM SUSP: 0.5000 mL | Freq: Once | INTRAMUSCULAR | Status: DC

## 2015-07-21 MED ORDER — ONDANSETRON HCL 4 MG/2ML IJ SOLN
INTRAMUSCULAR | Status: DC | PRN
  Administered 2015-07-21: 4 mg via INTRAVENOUS

## 2015-07-21 MED ORDER — CEFAZOLIN SODIUM-DEXTROSE 2-3 GM-% IV SOLR
INTRAVENOUS | Status: DC | PRN
  Administered 2015-07-21: 2 g via INTRAVENOUS

## 2015-07-21 MED ORDER — SIMETHICONE 80 MG PO CHEW
80.0000 mg | CHEWABLE_TABLET | Freq: Three times a day (TID) | ORAL | Status: DC
  Administered 2015-07-22 – 2015-07-24 (×7): 80 mg via ORAL
  Filled 2015-07-21 (×7): qty 1

## 2015-07-21 MED ORDER — DIPHENHYDRAMINE HCL 50 MG/ML IJ SOLN: 12.5000 mg | INTRAMUSCULAR | Status: DC | PRN

## 2015-07-21 MED ORDER — NALBUPHINE HCL 10 MG/ML IJ SOLN
5.0000 mg | INTRAMUSCULAR | Status: DC | PRN
  Administered 2015-07-22: 5 mg via INTRAVENOUS
  Filled 2015-07-21: qty 1

## 2015-07-21 MED ORDER — IBUPROFEN 600 MG PO TABS
600.0000 mg | ORAL_TABLET | Freq: Four times a day (QID) | ORAL | Status: DC
  Administered 2015-07-22 – 2015-07-24 (×10): 600 mg via ORAL
  Filled 2015-07-21 (×10): qty 1

## 2015-07-21 MED ORDER — OXYTOCIN 10 UNIT/ML IJ SOLN
1.0000 m[IU]/min | INTRAVENOUS | Status: DC
  Administered 2015-07-21: 1 m[IU]/min via INTRAVENOUS

## 2015-07-21 MED ORDER — TERBUTALINE SULFATE 1 MG/ML IJ SOLN: 0.2500 mg | Freq: Once | INTRAMUSCULAR | Status: DC | PRN

## 2015-07-21 MED ORDER — FENTANYL CITRATE (PF) 100 MCG/2ML IJ SOLN
INTRAMUSCULAR | Status: AC
  Filled 2015-07-21: qty 2

## 2015-07-21 MED ORDER — SENNOSIDES-DOCUSATE SODIUM 8.6-50 MG PO TABS
2.0000 | ORAL_TABLET | ORAL | Status: DC
  Administered 2015-07-22 – 2015-07-24 (×3): 2 via ORAL
  Filled 2015-07-21 (×3): qty 2

## 2015-07-21 MED ORDER — NALOXONE HCL 2 MG/2ML IJ SOSY: 1.0000 ug/kg/h | PREFILLED_SYRINGE | INTRAVENOUS | Status: DC | PRN

## 2015-07-21 MED ORDER — SCOPOLAMINE 1 MG/3DAYS TD PT72: 1.0000 | MEDICATED_PATCH | Freq: Once | TRANSDERMAL | Status: DC

## 2015-07-21 MED ORDER — ONDANSETRON HCL 4 MG/2ML IJ SOLN: 4.0000 mg | Freq: Three times a day (TID) | INTRAMUSCULAR | Status: DC | PRN

## 2015-07-21 MED ORDER — KETOROLAC TROMETHAMINE 30 MG/ML IJ SOLN
INTRAMUSCULAR | Status: AC
  Filled 2015-07-21: qty 1

## 2015-07-21 MED ORDER — MORPHINE SULFATE (PF) 0.5 MG/ML IJ SOLN
INTRAMUSCULAR | Status: AC
  Filled 2015-07-21: qty 10

## 2015-07-21 MED ORDER — LACTATED RINGERS IV SOLN: 500.0000 mL | Freq: Once | INTRAVENOUS | Status: DC

## 2015-07-21 MED ORDER — CITRIC ACID-SODIUM CITRATE 334-500 MG/5ML PO SOLN
ORAL | Status: AC
  Administered 2015-07-21: 30 mL
  Filled 2015-07-21: qty 15

## 2015-07-21 MED ORDER — HYDRALAZINE HCL 20 MG/ML IJ SOLN: 10.0000 mg | Freq: Once | INTRAMUSCULAR | Status: DC | PRN

## 2015-07-21 MED ORDER — LACTATED RINGERS IV SOLN
INTRAVENOUS | Status: DC | PRN
  Administered 2015-07-21 (×3): via INTRAVENOUS

## 2015-07-21 MED ORDER — MORPHINE SULFATE (PF) 0.5 MG/ML IJ SOLN
INTRAMUSCULAR | Status: DC | PRN
  Administered 2015-07-21: 4 mg via EPIDURAL

## 2015-07-21 MED ORDER — LANOLIN HYDROUS EX OINT: 1.0000 "application " | TOPICAL_OINTMENT | CUTANEOUS | Status: DC | PRN

## 2015-07-21 MED ORDER — LIDOCAINE HCL (PF) 1 % IJ SOLN
INTRAMUSCULAR | Status: DC | PRN
  Administered 2015-07-21 (×2): 4 mL via EPIDURAL

## 2015-07-21 MED ORDER — DIPHENHYDRAMINE HCL 25 MG PO CAPS: 25.0000 mg | ORAL_CAPSULE | ORAL | Status: DC | PRN

## 2015-07-21 MED ORDER — SODIUM CHLORIDE 0.9% FLUSH: 3.0000 mL | INTRAVENOUS | Status: DC | PRN

## 2015-07-21 MED ORDER — WITCH HAZEL-GLYCERIN EX PADS: 1.0000 "application " | MEDICATED_PAD | CUTANEOUS | Status: DC | PRN

## 2015-07-21 MED ORDER — DIBUCAINE 1 % RE OINT: 1.0000 "application " | TOPICAL_OINTMENT | RECTAL | Status: DC | PRN

## 2015-07-21 MED ORDER — PHENYLEPHRINE 40 MCG/ML (10ML) SYRINGE FOR IV PUSH (FOR BLOOD PRESSURE SUPPORT)
80.0000 ug | PREFILLED_SYRINGE | INTRAVENOUS | Status: DC | PRN
Start: 2015-07-21 — End: 2015-07-21

## 2015-07-21 MED ORDER — SIMETHICONE 80 MG PO CHEW
80.0000 mg | CHEWABLE_TABLET | ORAL | Status: DC
  Administered 2015-07-22 – 2015-07-24 (×3): 80 mg via ORAL
  Filled 2015-07-21 (×3): qty 1

## 2015-07-21 MED ORDER — SCOPOLAMINE 1 MG/3DAYS TD PT72
MEDICATED_PATCH | TRANSDERMAL | Status: DC | PRN
  Administered 2015-07-21: 1 via TRANSDERMAL

## 2015-07-21 MED ORDER — GUAIFENESIN 100 MG/5ML PO SOLN
5.0000 mL | ORAL | Status: DC | PRN
  Administered 2015-07-22 – 2015-07-24 (×6): 100 mg via ORAL
  Filled 2015-07-21 (×7): qty 15

## 2015-07-21 MED ORDER — KETOROLAC TROMETHAMINE 30 MG/ML IJ SOLN: 30.0000 mg | Freq: Four times a day (QID) | INTRAMUSCULAR | Status: AC | PRN

## 2015-07-21 MED ORDER — MEPERIDINE HCL 25 MG/ML IJ SOLN: 6.2500 mg | INTRAMUSCULAR | Status: DC | PRN

## 2015-07-21 MED ORDER — LABETALOL HCL 5 MG/ML IV SOLN: 20.0000 mg | INTRAVENOUS | Status: DC | PRN

## 2015-07-21 MED ORDER — MEASLES, MUMPS & RUBELLA VAC ~~LOC~~ INJ: 0.5000 mL | INJECTION | Freq: Once | SUBCUTANEOUS | Status: DC

## 2015-07-21 MED ORDER — ONDANSETRON HCL 4 MG/2ML IJ SOLN: 4.0000 mg | Freq: Once | INTRAMUSCULAR | Status: DC

## 2015-07-21 MED ORDER — OXYTOCIN 10 UNIT/ML IJ SOLN
INTRAMUSCULAR | Status: AC
  Filled 2015-07-21: qty 4

## 2015-07-21 MED ORDER — OXYTOCIN 10 UNIT/ML IJ SOLN
40.0000 [IU] | INTRAMUSCULAR | Status: DC | PRN
  Administered 2015-07-21: 40 [IU] via INTRAVENOUS

## 2015-07-21 MED ORDER — CEFAZOLIN SODIUM-DEXTROSE 2-4 GM/100ML-% IV SOLN
INTRAVENOUS | Status: AC
  Filled 2015-07-21: qty 100

## 2015-07-21 MED ORDER — GLYCOPYRROLATE 0.2 MG/ML IJ SOLN
INTRAMUSCULAR | Status: DC | PRN
  Administered 2015-07-21: 0.1 mg via INTRAVENOUS

## 2015-07-21 MED ORDER — DIPHENHYDRAMINE HCL 25 MG PO CAPS: 25.0000 mg | ORAL_CAPSULE | Freq: Four times a day (QID) | ORAL | Status: DC | PRN

## 2015-07-21 MED ORDER — BISACODYL 10 MG RE SUPP: 10.0000 mg | Freq: Every day | RECTAL | Status: DC | PRN

## 2015-07-21 MED ORDER — ACETAMINOPHEN 325 MG PO TABS
650.0000 mg | ORAL_TABLET | ORAL | Status: DC | PRN
  Administered 2015-07-22 – 2015-07-23 (×3): 650 mg via ORAL
  Filled 2015-07-21 (×3): qty 2

## 2015-07-21 MED ORDER — OXYTOCIN 10 UNIT/ML IJ SOLN: 2.5000 [IU]/h | INTRAVENOUS | Status: AC

## 2015-07-21 MED ORDER — KETOROLAC TROMETHAMINE 30 MG/ML IJ SOLN
30.0000 mg | Freq: Four times a day (QID) | INTRAMUSCULAR | Status: AC | PRN
  Administered 2015-07-21: 30 mg via INTRAMUSCULAR

## 2015-07-21 MED ORDER — FENTANYL 2.5 MCG/ML BUPIVACAINE 1/10 % EPIDURAL INFUSION (WH - ANES)
14.0000 mL/h | INTRAMUSCULAR | Status: DC | PRN
  Administered 2015-07-21 (×5): 14 mL/h via EPIDURAL
  Filled 2015-07-21 (×4): qty 125

## 2015-07-21 MED ORDER — ZOLPIDEM TARTRATE 5 MG PO TABS: 5.0000 mg | ORAL_TABLET | Freq: Every evening | ORAL | Status: DC | PRN

## 2015-07-21 MED ORDER — FENTANYL CITRATE (PF) 100 MCG/2ML IJ SOLN: 25.0000 ug | INTRAMUSCULAR | Status: DC | PRN

## 2015-07-21 MED ORDER — NALBUPHINE HCL 10 MG/ML IJ SOLN: 5.0000 mg | INTRAMUSCULAR | Status: DC | PRN

## 2015-07-21 MED ORDER — ONDANSETRON HCL 4 MG/2ML IJ SOLN
INTRAMUSCULAR | Status: AC
  Filled 2015-07-21: qty 2

## 2015-07-21 MED ORDER — SIMETHICONE 80 MG PO CHEW: 80.0000 mg | CHEWABLE_TABLET | ORAL | Status: DC | PRN

## 2015-07-21 MED ORDER — PRENATAL MULTIVITAMIN CH
1.0000 | ORAL_TABLET | Freq: Every day | ORAL | Status: DC
  Administered 2015-07-22 – 2015-07-24 (×3): 1 via ORAL
  Filled 2015-07-21 (×3): qty 1

## 2015-07-21 SURGICAL SUPPLY — 38 items
BENZOIN TINCTURE PRP APPL 2/3 (GAUZE/BANDAGES/DRESSINGS) ×3 IMPLANT
CHLORAPREP W/TINT 26ML (MISCELLANEOUS) ×3 IMPLANT
CLAMP CORD UMBIL (MISCELLANEOUS) IMPLANT
CLOSURE WOUND 1/2 X4 (GAUZE/BANDAGES/DRESSINGS) ×1
CLOSURE WOUND 1/4X4 (GAUZE/BANDAGES/DRESSINGS) ×1
CLOTH BEACON ORANGE TIMEOUT ST (SAFETY) ×3 IMPLANT
CONTAINER PREFILL 10% NBF 15ML (MISCELLANEOUS) IMPLANT
DRSG OPSITE POSTOP 4X10 (GAUZE/BANDAGES/DRESSINGS) ×3 IMPLANT
ELECT REM PT RETURN 9FT ADLT (ELECTROSURGICAL) ×3
ELECTRODE REM PT RTRN 9FT ADLT (ELECTROSURGICAL) ×1 IMPLANT
EXTRACTOR VACUUM M CUP 4 TUBE (SUCTIONS) IMPLANT
EXTRACTOR VACUUM M CUP 4' TUBE (SUCTIONS)
GLOVE BIO SURGEON STRL SZ7.5 (GLOVE) ×3 IMPLANT
GLOVE BIOGEL PI IND STRL 7.0 (GLOVE) ×2 IMPLANT
GLOVE BIOGEL PI IND STRL 7.5 (GLOVE) ×1 IMPLANT
GLOVE BIOGEL PI INDICATOR 7.0 (GLOVE) ×4
GLOVE BIOGEL PI INDICATOR 7.5 (GLOVE) ×2
GOWN STRL REUS W/TWL LRG LVL3 (GOWN DISPOSABLE) ×9 IMPLANT
KIT ABG SYR 3ML LUER SLIP (SYRINGE) IMPLANT
NEEDLE HYPO 25X5/8 SAFETYGLIDE (NEEDLE) IMPLANT
NS IRRIG 1000ML POUR BTL (IV SOLUTION) ×3 IMPLANT
PACK C SECTION WH (CUSTOM PROCEDURE TRAY) ×3 IMPLANT
PAD ABD 7.5X8 STRL (GAUZE/BANDAGES/DRESSINGS) ×3 IMPLANT
PAD OB MATERNITY 4.3X12.25 (PERSONAL CARE ITEMS) ×3 IMPLANT
PENCIL SMOKE EVAC W/HOLSTER (ELECTROSURGICAL) ×3 IMPLANT
RTRCTR C-SECT PINK 25CM LRG (MISCELLANEOUS) ×3 IMPLANT
SPONGE GAUZE 4X4 12PLY STER LF (GAUZE/BANDAGES/DRESSINGS) ×6 IMPLANT
STRIP CLOSURE SKIN 1/2X4 (GAUZE/BANDAGES/DRESSINGS) ×2 IMPLANT
STRIP CLOSURE SKIN 1/4X4 (GAUZE/BANDAGES/DRESSINGS) ×2 IMPLANT
SUT CHROMIC 2 0 CT 1 (SUTURE) ×3 IMPLANT
SUT MNCRL AB 3-0 PS2 27 (SUTURE) ×3 IMPLANT
SUT PLAIN 0 NONE (SUTURE) IMPLANT
SUT PLAIN 2 0 XLH (SUTURE) ×3 IMPLANT
SUT VIC AB 0 CT1 36 (SUTURE) ×3 IMPLANT
SUT VIC AB 0 CTX 36 (SUTURE) ×8
SUT VIC AB 0 CTX36XBRD ANBCTRL (SUTURE) ×4 IMPLANT
TOWEL OR 17X24 6PK STRL BLUE (TOWEL DISPOSABLE) ×3 IMPLANT
TRAY FOLEY CATH SILVER 14FR (SET/KITS/TRAYS/PACK) ×3 IMPLANT

## 2015-07-21 NOTE — Consult Note (Signed)
[facility]  Delivery Note:  C-section       07/21/2015  8:34 PM  I was called to the operating room at the request of the patient's obstetrician (Dr. Normand Sloopillard) for a primary c-section.  PRENATAL HX:  This is a 30 y/o G1P0 at 738 and 3/[redacted] weeks gestation who was admitted on 4/1 for PROM.  ROM was ~37 hours.  Pregnancy complicated by previa (resolved) and Rh negative status (refused rhogham because spouse had same blood type).  She was also GBS positive but received adequate treatment.  C-section for failure to progress.    DELIVERY:  Infant was vigorous at delivery, requiring no resuscitation other than standard warming, drying and stimulation.  APGARs 8 and 9.  Exam within normal limits.  After 5 minutes, baby left with nurse to assist parents with skin-to-skin care.   _____________________ Electronically Signed By: Maryan CharLindsey Parks Czajkowski, MD Neonatologist

## 2015-07-21 NOTE — Anesthesia Procedure Notes (Signed)
Epidural Patient location during procedure: OB Start time: 07/21/2015 1:19 AM  Staffing Anesthesiologist: Mal AmabileFOSTER, Milayna Rotenberg Performed by: anesthesiologist   Preanesthetic Checklist Completed: patient identified, site marked, surgical consent, pre-op evaluation, timeout performed, IV checked, risks and benefits discussed and monitors and equipment checked  Epidural Patient position: sitting Prep: site prepped and draped and DuraPrep Patient monitoring: continuous pulse ox and blood pressure Approach: midline Location: L3-L4 Injection technique: LOR air  Needle:  Needle type: Tuohy  Needle gauge: 17 G Needle length: 9 cm and 9 Needle insertion depth: 5 cm cm Catheter type: closed end flexible Catheter size: 19 Gauge Catheter at skin depth: 10 cm Test dose: negative and Other  Assessment Events: blood not aspirated, injection not painful, no injection resistance, negative IV test and no paresthesia  Additional Notes Patient identified. Risks and benefits discussed including failed block, incomplete  Pain control, post dural puncture headache, nerve damage, paralysis, blood pressure Changes, nausea, vomiting, reactions to medications-both toxic and allergic and post Partum back pain. All questions were answered. Patient expressed understanding and wished to proceed. Sterile technique was used throughout procedure. Epidural site was Dressed with sterile barrier dressing. No paresthesias, signs of intravascular injection Or signs of intrathecal spread were encountered.  Patient was more comfortable after the epidural was dosed. Please see RN's note for documentation of vital signs and FHR which are stable.

## 2015-07-21 NOTE — Progress Notes (Addendum)
Labor Progress  Subjective: Fells itchy  Objective: BP 134/64 mmHg  Pulse 92  Temp(Src) 100.2 F (37.9 C) (Axillary)  Resp 20  Ht 5\' 4"  (1.626 m)  Wt 223 lb (101.152 kg)  BMI 38.26 kg/m2  SpO2 98%  LMP 07/16/2014 (Approximate) I/O last 3 completed shifts: In: 60 [P.O.:60] Out: 700 [Urine:700] Total I/O In: -  Out: 450 [Urine:450] FHT: 150,moderate variability, + accel,  Occasional variable decel  CTX:  regular, every 3 minutes Uterus gravid, soft non tender SVE:  Dilation: 10 Effacement (%): 100 Station: 0, -1 Exam by:: venus standard,cnm Pitocin at 3211mUn/min  Assessment:  IUP at 38.3 weeks  NICHD: Category 2 Membranes:  SROM x 34 hrs, no s/s of infection  MVUs 220  Induction: Pitocin  Pain management: Epidural   GBS positive  Abx:7  Plan: Continue labor plan Continuous monitoring Rest Frequent position changes to facilitate fetal rotation and descent. Pt allowed to labor down Will reassess with cervical exam at 1600 or earlier if necessary Continue pitocin per protocol   Venus Standard, CNM, MSN 07/21/2015. 4:12 PM   Called by CNM to evaluate pt whose exam at that time was FD/-1.  Per report pt was FD since 3:15p with no further descent and had been contracting adequately.  The CNM was also concerned about decels that were occurring with pushing.  Upon my arrival, pt exam was still FD/-1.  There is caput the descends beyond inlet but the skull is still at -1.  I recommended c-section at about 6:15p but the pt was not ready to proceed at that time.  I continued to push with the pt for another half hour and explained that the head was too high for operative vaginal delivery and I think there is CPD.  The baby is about 8 1/2 lbs and was 7lbs 11oz a couple of weeks ago on u/s.  Neither she nor her husband wanted to proceed but the patient understood the situation.  Ultimately at about 6:45 or so she wanted to speak with her mother.  It was after that  conversation that the patient agreed to proceed.  R/B/A discussed with patient including but not limited to bleeding infection and injury.  The fetal heart tracing had recovered by the time I arrived at the hospital to evaluate the patient and there was good variability without decels.

## 2015-07-21 NOTE — Anesthesia Preprocedure Evaluation (Signed)
Anesthesia Evaluation  Patient identified by MRN, date of birth, ID band Patient awake    Reviewed: Allergy & Precautions, Patient's Chart, lab work & pertinent test results  Airway Mallampati: III  TM Distance: >3 FB Neck ROM: Full    Dental no notable dental hx. (+) Teeth Intact   Pulmonary asthma ,    Pulmonary exam normal breath sounds clear to auscultation       Cardiovascular negative cardio ROS Normal cardiovascular exam Rhythm:Regular Rate:Normal     Neuro/Psych negative neurological ROS  negative psych ROS   GI/Hepatic Neg liver ROS, GERD  Medicated and Controlled,  Endo/Other  Morbid obesity  Renal/GU negative Renal ROS  negative genitourinary   Musculoskeletal negative musculoskeletal ROS (+)   Abdominal (+) + obese,   Peds  Hematology   Anesthesia Other Findings   Reproductive/Obstetrics (+) Pregnancy                             Anesthesia Physical Anesthesia Plan  ASA: III  Anesthesia Plan: Epidural   Post-op Pain Management:    Induction:   Airway Management Planned: Natural Airway  Additional Equipment:   Intra-op Plan:   Post-operative Plan:   Informed Consent: I have reviewed the patients History and Physical, chart, labs and discussed the procedure including the risks, benefits and alternatives for the proposed anesthesia with the patient or authorized representative who has indicated his/her understanding and acceptance.     Plan Discussed with: Anesthesiologist  Anesthesia Plan Comments:         Anesthesia Quick Evaluation

## 2015-07-21 NOTE — Progress Notes (Signed)
Spouse at bedside.  Subjective: Happy and comfortable w/ epidural. Denies VB. +FM.Guaifenesin helpful.    Objective: BP 155/79 mmHg  Pulse 103  Temp(Src) 98.4 F (36.9 C) (Oral)  Resp 18  Ht 5\' 4"  (1.626 m)  Wt 101.152 kg (223 lb)  BMI 38.26 kg/m2  SpO2 98%  LMP 07/16/2014 (Approximate) I/O last 3 completed shifts: In: 60 [P.O.:60] Out: 400 [Urine:400] Today's Vitals   07/21/15 0432 07/21/15 0501 07/21/15 0531 07/21/15 0601  BP: 137/66 145/70 142/82 155/79  Pulse: 95 89 110 103  Temp:   98.4 F (36.9 C)   TempSrc:   Oral   Resp:      Height:      Weight:      SpO2:      PainSc:  0-No pain  0-No pain   FHT: BL 135 w/ moderate variability, +accels, mild variables - intrauterine resuscitative measures in place  UC:   irregular, every 2-4 minutes SVE: 4/thick/-3 @ [id number]   Pitocin at 9 mU/min IUPC placed   Assessment:  Latent labor. Overall reassuring FHT; however, IUPC placed to better assess uterine contractions as unable to clearly determine if variables are in late position with external monitor.  Mild range BPs - anti-hypertensives not required.  Plan: Continue Pitocin augmentation and close observation of BPs. Expect further progress. Consult as indicated.  Sherre ScarletWILLIAMS, Quinnlan Abruzzo CNM 07/21/2015, 6:22 AM

## 2015-07-21 NOTE — Transfer of Care (Signed)
Immediate Anesthesia Transfer of Care Note  Patient: Barbara Stevens  Procedure(s) Performed: Procedure(s): CESAREAN SECTION (N/A)  Patient Location: PACU  Anesthesia Type:Epidural  Level of Consciousness: awake  Airway & Oxygen Therapy: Patient Spontanous Breathing  Post-op Assessment: Report given to RN and Post -op Vital signs reviewed and stable  Post vital signs: stable  Last Vitals:  Filed Vitals:   07/21/15 1901 07/21/15 1911  BP: 158/86   Pulse: 97   Temp:    Resp:  20    Complications: No apparent anesthesia complications

## 2015-07-21 NOTE — Progress Notes (Addendum)
Labor Progress  Subjective: No complaints  Objective: BP 134/64 mmHg  Pulse 92  Temp(Src) 100.2 F (37.9 C) (Axillary)  Resp 20  Ht 5\' 4"  (1.626 m)  Wt 223 lb (101.152 kg)  BMI 38.26 kg/m2  SpO2 98%  LMP 07/16/2014 (Approximate) I/O last 3 completed shifts: In: 60 [P.O.:60] Out: 700 [Urine:700] Total I/O In: -  Out: 450 [Urine:450] FHT: 140, moderate variability, + accel, occasional variable decel CTX:  regular, every 3-4 minutes Uterus gravid, soft non tender SVE:  AL/-1 Pitocin at 11 mUn/min  Assessment:  IUP at 39.3 weeks NICHD: Category 2 Membranes:  SROM x 19hrs, no s/s of infection  MVUs 150  Induction:  Pitocin  Pain management: Epidural  GBS positive  Abx:6  Plan: Continue labor plan Continuous monitoring Rest Frequent position changes to facilitate fetal rotation and descent. Will reassess with cervical exam at 1500 or earlier if necessary Continue pitocin per protocol   Barbara Stevens, CNM, MSN 07/21/2015. 4:06 PM

## 2015-07-21 NOTE — Op Note (Signed)
Cesarean Section Procedure Note   Barbara Stevens  07/20/2015 - 07/21/2015  Indications: Failure to descend   Pre-operative Diagnosis: failure to descend  Post-operative Diagnosis: Same , direct OP and nuchal cord  Surgeon: Surgeon(s) and Role:    * Jaymes GraffNaima Berley Gambrell, MD - Primary   Assistants: Sherre ScarletKimberly Williams CNM   Anesthesia: epidural   Procedure Details:  The patient was seen in the Holding Room. The risks, benefits, complications, treatment options, and expected outcomes were discussed with the patient. The patient concurred with the proposed plan, giving informed consent. identified as Barbara RotundaSamantha Alabi and the procedure verified as C-Section Delivery. A Time Out was held and the above information confirmed.  After induction of anesthesia, the patient was draped and prepped in the usual sterile manner. A transverse incision was made and carried down through the subcutaneous tissue to the fascia. Fascial incision was made in the midline and extended transversely. The fascia was separated from the underlying rectus muscle superiorly and inferiorly. The peritoneum was identified and entered. Peritoneal incision was extended longitudinally with good visualization of bowel and bladder. The utero-vesical peritoneal reflection was incised transversely and the bladder flap was bluntly freed from the lower uterine segment.  An alexsis retractor was placed in the abdomen.   A low transverse uterine incision was made. Delivered from cephalic presentation was a  infant, with Apgar scores of 8 at one minute and 9 at five minutes. Cord ph was PH7.37 the umbilical cord was clamped and cut cord blood was obtained for evaluation. The placenta was removed Intact and appeared normal. The uterine outline, tubes and ovaries appeared normal}. The uterine incision was closed with running locked sutures of 0Vicryl. A second layer 0 vicrlyl was used to imbricate the uterine incision.  Surgicel was placed along the incision.      Hemostasis was observed. Lavage was carried out until clear. The alexsis was removed.  The peritoneum was closed with 0 chromic.  The muscles were examined and any bleeders were made hemostatic using bovie cautery device.   The fascia was then reapproximated with running sutures of 0 vicryl.  The subcutaneous tissue was reapproximated  With interrupted stitches using 2-0 plain gut. The subcuticular closure was performed using 3-550monocryl. Liquid band was also used to reaproximate the incision     Instrument, sponge, and needle counts were correct prior the abdominal closure and were correct at the conclusion of the case.    Findings: infant was delivered from vtx  presentation. The fluid was clear.  The uterus tubes and ovaries appeared normal.     Estimated Blood Loss: 700cc   Total IV Fluids: 3000ml   Urine Output: 250CC OF clear urine  Specimens: placenta to patholofy  Complications: no complications  Disposition: PACU - hemodynamically stable.   Maternal Condition: stable   Baby condition / location:  Couplet care / Skin to Skin  Attending Attestation: I performed the procedure.   Signed: Surgeon(s): Jaymes GraffNaima Yamaira Spinner, MD

## 2015-07-21 NOTE — Progress Notes (Signed)
Pt has decided to proceed with CS. She understands the risks are but not limited to bleeding, infection damage to internal organs such as bowel and bladder and classical cs bc of the head being lodged in bc she had been complete for 6 hours.

## 2015-07-21 NOTE — Progress Notes (Signed)
Addendum  Trial pushing at [id number] with little progress Pt allowed to continue to labor down. Resume pushing at 1731.  Dr. Su Hiltoberts called in to evaluate at 1755. Dr Su Hiltoberts at the bedside at 1814, pushing continue.   Recommendation for CS.  AT 1849 Pt state she wants to talk to her mother before making a decision

## 2015-07-21 NOTE — Consults (Signed)
  Anesthesia Pain Consult Note  Patient: Barbara RotundaSamantha Stevens, 30 y.o., female  Consult Requested by: Jaymes GraffNaima Dillard, MD  Reason for Consult: CRNA Pain Management Epidural in place Pain 2 Goal 3

## 2015-07-21 NOTE — Progress Notes (Signed)
Labor Progress  Subjective: No complaints, comfortable with epidural  Objective: BP 141/107 mmHg  Pulse 92  Temp(Src) 98.4 F (36.9 C) (Oral)  Resp 18  Ht 5\' 4"  (1.626 m)  Wt 223 lb (101.152 kg)  BMI 38.26 kg/m2  SpO2 98%  LMP 07/16/2014 (Approximate) I/O last 3 completed shifts: In: 60 [P.O.:60] Out: 700 [Urine:700]   FHT: 140, moderate variability, +accel, occasional variable decel CTX:  regular, every 4-5 minutes Uterus gravid, soft non tender SVE:  Dilation: 8 Effacement (%): 80 Station: -2 Exam by:: [email or web address],rnc-ob Pitocin at 2311mUn/min  Assessment:  IUP at 38.3 weeks NICHD: Category 2 Membranes:  SROM x 29hrs, no s/s of infection  MVUs 200  Induction:   Pitocin - 11mu  Pain management: Epidural GBS positive  Abx:6  Plan: Continue labor plan Continuous monitoring Rest Frequent position changes to facilitate fetal rotation and descent. Will reassess with cervical exam at 1200 or earlier if necessary Continue pitocin per protocol    Barbara Stevens, CNM, MSN 07/21/2015. 11:12 AM

## 2015-07-21 NOTE — Progress Notes (Signed)
Anesthesia notified and patient transferred to room 129 from PACU.

## 2015-07-22 ENCOUNTER — Encounter (HOSPITAL_COMMUNITY): Payer: Self-pay | Admitting: Obstetrics and Gynecology

## 2015-07-22 LAB — CBC
HEMATOCRIT: 24.7 % — AB (ref 36.0–46.0)
HEMOGLOBIN: 8.5 g/dL — AB (ref 12.0–15.0)
MCH: 28.5 pg (ref 26.0–34.0)
MCHC: 34.4 g/dL (ref 30.0–36.0)
MCV: 82.9 fL (ref 78.0–100.0)
Platelets: 186 10*3/uL (ref 150–400)
RBC: 2.98 MIL/uL — ABNORMAL LOW (ref 3.87–5.11)
RDW: 13.7 % (ref 11.5–15.5)
WBC: 10.4 10*3/uL (ref 4.0–10.5)

## 2015-07-22 MED ORDER — LACTATED RINGERS IV BOLUS (SEPSIS)
500.0000 mL | Freq: Once | INTRAVENOUS | Status: AC
  Administered 2015-07-22: 500 mL via INTRAVENOUS

## 2015-07-22 NOTE — Progress Notes (Signed)
Patient output has increased since 500cc bolus[facility]  Noticed at shift change that color is becoming more pink/red tinged than earlier. Noted to day nurse who will pass on to medical team.  Theda SersJan Jahfari Ambers, RN

## 2015-07-22 NOTE — Lactation Note (Signed)
This note was copied from a baby's chart. Lactation Consultation Note New mom c-section having difficulty latching per self. Has short shaft nipples. Slightly compressible, baby unable to obtain deep latch and gets frustrated and cries to Rt. Side. Hand expressed 5 ml colostrum, gave w/curve tip syring. Has a cephalhematoma to head. Mom given shells to wear between feedings later today in bra. Hand pump given to evert nipples more. Fitted for #20 NS. Application taught. Mom sleepy and tired. Will need further assistance. Mom encouraged to feed baby 8-12 times/24 hours and with feeding cues. Educated about newborn behavior, STS. I&O. Parents will need extensive teaching at this time. Referred to Baby and Me Book in Breastfeeding section Pg. 22-23 for position options and Proper latch demonstration.[facility] brochure given w/resources, support groups and LC services. Patient Name: Barbara Rollene RotundaSamantha Vanmeter AOZHY'QToday's Date: 07/22/2015 Reason for consult: Initial assessment   Maternal Data Has patient been taught Hand Expression?: Yes Does the patient have breastfeeding experience prior to this delivery?: No  Feeding Feeding Type: Breast Milk Length of feed: 0 min  LATCH Score/Interventions Latch: Too sleepy or reluctant, no latch achieved, no sucking elicited. Intervention(s): Teach feeding cues;Waking techniques  Audible Swallowing: None Intervention(s): Hand expression Intervention(s): Alternate breast massage;Hand expression  Type of Nipple: Everted at rest and after stimulation (short shaft)  Comfort (Breast/Nipple): Soft / non-tender     Hold (Positioning): Full assist, staff holds infant at breast Intervention(s): Skin to skin;Position options;Support Pillows;Breastfeeding basics reviewed  LATCH Score: 4  Lactation Tools Discussed/Used Tools: Shells;Nipple Dorris CarnesShields;Pump Nipple shield size: 20 Shell Type: Inverted Breast pump type: Manual WIC Program: No Pump Review: Setup, frequency, and  cleaning;Milk Storage Initiated by:: Peri JeffersonL. Itzae Miralles RN Date initiated:: 07/22/15   Consult Status Consult Status: Follow-up Date: 07/22/15 Follow-up type: In-patient    Clayton Jarmon, Diamond NickelLAURA G 07/22/2015, 4:02 AM

## 2015-07-22 NOTE — Progress Notes (Signed)
Notified K. Williams, CNM of patient's decrease in output between 1-4 a.m.  New order for 500 cc bolus of LR over 30 minutes.  Theda SersJan Latrica Clowers, RN

## 2015-07-22 NOTE — Lactation Note (Signed)
This note was copied from a baby's chart. Lactation Consultation Note  Baby sleepy and mother states she will call for help when baby cues. Parents state breastfeeding is improving.   Patient Name: Barbara Stevens's Date: 07/22/2015 Reason for consult: Follow-up assessment   Maternal Data    Feeding Feeding Type: Breast Fed Length of feed: 12 min  LATCH Score/Interventions                      Lactation Tools Discussed/Used     Consult Status Consult Status: Follow-up Date: 07/22/15 Follow-up type: In-patient    Dahlia ByesBerkelhammer, Ruth [facility] 07/22/2015, 3:12 PM

## 2015-07-22 NOTE — Anesthesia Postprocedure Evaluation (Signed)
Anesthesia Post Note  Patient: Occupational hygienistamantha Buege  Procedure(s) Performed: Procedure(s) (LRB): CESAREAN SECTION (N/A)  Patient location during evaluation: Mother Baby Anesthesia Type: Epidural Level of consciousness: awake and alert Pain management: pain level controlled Vital Signs Assessment: post-procedure vital signs reviewed and stable Respiratory status: spontaneous breathing, nonlabored ventilation and respiratory function stable Cardiovascular status: stable Postop Assessment: no headache, no backache and epidural receding Anesthetic complications: no    Last Vitals:  Filed Vitals:   07/22/15 0223 07/22/15 0548  BP: 113/51 118/58  Pulse: 78 70  Temp: 36.6 C 36.5 C  Resp: 18 18    Last Pain:  Filed Vitals:   07/22/15 0702  PainSc: 2                  Gesenia Bantz

## 2015-07-22 NOTE — Anesthesia Postprocedure Evaluation (Signed)
Anesthesia Post Note  Patient: Barbara Stevens  Procedure(s) Performed: Procedure(s) (LRB): CESAREAN SECTION (N/A)  Patient location during evaluation: PACU Anesthesia Type: Epidural Level of consciousness: oriented and awake and alert Pain management: pain level controlled Vital Signs Assessment: post-procedure vital signs reviewed and stable Respiratory status: spontaneous breathing, respiratory function stable and patient connected to nasal cannula oxygen Cardiovascular status: blood pressure returned to baseline and stable Postop Assessment: no headache, no backache and epidural receding Anesthetic complications: no    Last Vitals:  Filed Vitals:   07/21/15 2245 07/21/15 2330  BP: 145/88 137/69  Pulse: 106 96  Temp:  36.9 C  Resp: 19 18    Last Pain:  Filed Vitals:   07/21/15 2352  PainSc: 2                  Phillips Groutarignan, Shonda Mandarino

## 2015-07-22 NOTE — Addendum Note (Signed)
Addendum  created 07/22/15 0739 by Junious SilkMelinda Coltrane Tugwell, CRNA   Modules edited: Charges VN, Clinical Notes   Clinical Notes:  File: [id number]

## 2015-07-22 NOTE — Progress Notes (Addendum)
Rollene RotundaSamantha Hirschi [id number]  Subjective: Postpartum Day 1: Primary LTC/S due to FTD Patient has stood at bedside, foley still in to allow for close observation of output this am. Feeding:  Breast Contraceptive plan:  Undecided at present  Objective: Temp:  [97.7 F (36.5 C)-100.2 F (37.9 C)] 97.8 F (36.6 C) (04/03 0935) Pulse Rate:  [70-123] 75 (04/03 0935) Resp:  [18-24] 18 (04/03 0935) BP: (93-165)/(51-110) 133/66 mmHg (04/03 0935) SpO2:  [95 %-100 %] 98 % (04/03 0935)   Filed Vitals:   07/22/15 0140 07/22/15 0223 07/22/15 0548 07/22/15 0935  BP: 136/78 113/51 118/58 133/66  Pulse: 90 78 70 75  Temp: 98 F (36.7 C) 97.9 F (36.6 C) 97.7 F (36.5 C) 97.8 F (36.6 C)  TempSrc:    Oral  Resp: 18 18 18 18   Height:      Weight:      SpO2: 96% 97% 97% 98%    Orthostatics stable  CBC Latest Ref Rng 07/22/2015 07/21/2015 07/20/2015  WBC 4.0 - 10.5 K/uL 10.4 11.7(H) 9.3  Hemoglobin 12.0 - 15.0 g/dL 0.3(K8.5(L) 74.212.0 59.512.3  Hematocrit 36.0 - 46.0 % 24.7(L) 33.6(L) 34.9(L)  Platelets 150 - 400 K/uL 186 283 258   CMP Latest Ref Rng 07/20/2015 07/23/2014  Glucose 65 - 99 mg/dL 638(V112(H) 564(P111(H)  BUN 6 - 20 mg/dL 10 10  Creatinine 3.290.44 - 1.00 mg/dL 5.180.65 8.410.72  Sodium 660135 - 145 mmol/L 135 142  Potassium 3.5 - 5.1 mmol/L 4.0 3.5  Chloride 101 - 111 mmol/L 104 109  CO2 22 - 32 mmol/L 22 26  Calcium 8.9 - 10.3 mg/dL 6.3(K8.8(L) 9.2  Total Protein 6.5 - 8.1 g/dL 6.8 -  Total Bilirubin 0.3 - 1.2 mg/dL 0.3 -  Alkaline Phos 38 - 126 U/L 143(H) -  AST 15 - 41 U/L 47(H) -  ALT 14 - 54 U/L 50 -      Physical Exam:  General: alert Lochia: appropriate Uterine Fundus: firm Abdomen:  + bowel sounds Incision: Honeycomb dressing CDI DVT Evaluation: No evidence of DVT seen on physical exam. Negative Homan's sign.  I/O balance since delivery: +940 Had decreased output during early am--given IV bolus, with good output response, urine now clear (had been amber). Output this am--1150  cc     Assessment/Plan: Status post cesarean delivery, day 1--FTD Anemia, stable hemodynamically. Declines transfusion Fe BID Stable Continue current care. Recheck CBC tomorrow, with CMP (hx mild elevation of LFTs) D/c foley May shower.    Nigel BridgemanLATHAM, VICKI MSN, CNM 07/22/2015, 12:53 PM   I saw and examined patient and agree with above findings, assessment and plan. May remove foley now as urine has cleared.  Dr. Sallye OberKulwa.

## 2015-07-23 DIAGNOSIS — O26899 Other specified pregnancy related conditions, unspecified trimester: Secondary | ICD-10-CM

## 2015-07-23 DIAGNOSIS — B951 Streptococcus, group B, as the cause of diseases classified elsewhere: Secondary | ICD-10-CM

## 2015-07-23 DIAGNOSIS — Z6791 Unspecified blood type, Rh negative: Secondary | ICD-10-CM

## 2015-07-23 DIAGNOSIS — Z98891 History of uterine scar from previous surgery: Secondary | ICD-10-CM

## 2015-07-23 LAB — COMPREHENSIVE METABOLIC PANEL
ALBUMIN: 2.5 g/dL — AB (ref 3.5–5.0)
ALK PHOS: 93 U/L (ref 38–126)
ALT: 32 U/L (ref 14–54)
AST: 40 U/L (ref 15–41)
Anion gap: 7 (ref 5–15)
BUN: 12 mg/dL (ref 6–20)
CALCIUM: 8.5 mg/dL — AB (ref 8.9–10.3)
CHLORIDE: 109 mmol/L (ref 101–111)
CO2: 23 mmol/L (ref 22–32)
CREATININE: 0.77 mg/dL (ref 0.44–1.00)
GFR calc Af Amer: >60 mL/min (ref >60)
GFR calc non Af Amer: >60 mL/min (ref >60)
GLUCOSE: 90 mg/dL (ref 65–99)
Potassium: 4 mmol/L (ref 3.5–5.1)
SODIUM: 139 mmol/L (ref 135–145)
Total Bilirubin: 0.3 mg/dL (ref 0.3–1.2)
Total Protein: 5.6 g/dL — ABNORMAL LOW (ref 6.5–8.1)

## 2015-07-23 LAB — CBC
HCT: 24.5 % — ABNORMAL LOW (ref 36.0–46.0)
HEMOGLOBIN: 8.4 g/dL — AB (ref 12.0–15.0)
MCH: 28.4 pg (ref 26.0–34.0)
MCHC: 34.3 g/dL (ref 30.0–36.0)
MCV: 82.8 fL (ref 78.0–100.0)
PLATELETS: 208 10*3/uL (ref 150–400)
RBC: 2.96 MIL/uL — AB (ref 3.87–5.11)
RDW: 13.9 % (ref 11.5–15.5)
WBC: 10.8 10*3/uL — AB (ref 4.0–10.5)

## 2015-07-23 MED ORDER — OXYCODONE-ACETAMINOPHEN 5-325 MG PO TABS
1.0000 | ORAL_TABLET | Freq: Four times a day (QID) | ORAL | Status: DC | PRN
  Administered 2015-07-23 – 2015-07-24 (×2): 1 via ORAL
  Filled 2015-07-23 (×2): qty 1

## 2015-07-23 MED ORDER — PSEUDOEPHEDRINE HCL ER 120 MG PO TB12
120.0000 mg | ORAL_TABLET | Freq: Two times a day (BID) | ORAL | Status: DC
  Administered 2015-07-23 (×2): 120 mg via ORAL
  Filled 2015-07-23 (×5): qty 1

## 2015-07-23 NOTE — Lactation Note (Signed)
This note was copied from a baby's chart. Lactation Consultation Note New mom having trouble latching baby w/wo NS. Baby arching, screaming, will not latch. Acts hungry. Tried several different positions. Baby refused to latch w/NS. In lay back position to Lt. Breast. Lt. Nipple is more everted than Rt. Nipple. Baby BF for 5 minutes before getting fussy again. When baby laid on moms chest she stopped screaming. When tried to latch, baby cried. Mom has #20 NS, slightly large. Fitted #16 fitted Rt. Nipple better. Hand expressed 4ml colostrum, inserted in NS, baby wouldn't latch. Gave w/curve tip syring and gloved finger. Mom appears not to be catching on w/latching and needs more assistance. Has had several long sessions w/lactation. Mom states she got her latched earlier and baby BF well. Encouraged to try later when baby settles down. Encouraged mom to wear shells to evert nipples. Reported to RN. Mom is not ready for discharge unles show significant improvement in self latching and BF.  Patient Name: Barbara Stevens ZOXWR'UToday's Date: 07/23/2015 Reason for consult: Follow-up assessment;Difficult latch   Maternal Data    Feeding Feeding Type: Breast Milk Length of feed: 5 min  LATCH Score/Interventions Latch: Repeated attempts needed to sustain latch, nipple held in mouth throughout feeding, stimulation needed to elicit sucking reflex. Intervention(s): Skin to skin;Teach feeding cues;Waking techniques Intervention(s): Adjust position;Assist with latch;Breast massage;Breast compression  Audible Swallowing: A few with stimulation Intervention(s): Hand expression Intervention(s): Alternate breast massage  Type of Nipple: Everted at rest and after stimulation  Comfort (Breast/Nipple): Soft / non-tender     Hold (Positioning): Full assist, staff holds infant at breast Intervention(s): Position options;Skin to skin;Support Pillows;Breastfeeding basics reviewed  LATCH Score: 6  Lactation  Tools Discussed/Used Tools: Shells;Nipple Dorris CarnesShields;Pump Nipple shield size: 20;16 Shell Type: Inverted Breast pump type: Manual   Consult Status Consult Status: Follow-up Date: 07/23/15 Follow-up type: In-patient    Shelby Anderle, Diamond NickelLAURA G 07/23/2015, 6:00 AM

## 2015-07-23 NOTE — Progress Notes (Signed)
Barbara RotundaSamantha Termine [id number]  Subjective: Postpartum Day 2: Primary LTC/S due to FTD Per RN, Patient up ad lib, reports no syncope or dizziness, C/o cough Feeding:  Breast Contraceptive plan: remains undecided  Objective: Temp:  [97.7 F (36.5 C)-98.2 F (36.8 C)] 98.2 F (36.8 C) (04/04 1753) Pulse Rate:  [57-97] 97 (04/04 1753) Resp:  [18-20] 18 (04/04 1753) BP: (113-147)/(53-67) 147/67 mmHg (04/04 1753)  CBC Latest Ref Rng 07/23/2015 07/22/2015 07/21/2015  WBC 4.0 - 10.5 K/uL 10.8(H) 10.4 11.7(H)  Hemoglobin 12.0 - 15.0 g/dL 7.4(Q8.4(L) 5.9(D8.5(L) 63.812.0  Hematocrit 36.0 - 46.0 % 24.5(L) 24.7(L) 33.6(L)  Platelets 150 - 400 K/uL 208 186 283   Results for orders placed or performed during the hospital encounter of 07/20/15 (from the past 24 hour(s))  CBC     Status: Abnormal   Collection Time: 07/23/15  5:08 AM  Result Value Ref Range   WBC 10.8 (H) 4.0 - 10.5 K/uL   RBC 2.96 (L) 3.87 - 5.11 MIL/uL   Hemoglobin 8.4 (L) 12.0 - 15.0 g/dL   HCT 75.624.5 (L) 43.336.0 - 29.546.0 %   MCV 82.8 78.0 - 100.0 fL   MCH 28.4 26.0 - 34.0 pg   MCHC 34.3 30.0 - 36.0 g/dL   RDW 18.813.9 41.611.5 - 60.615.5 %   Platelets 208 150 - 400 K/uL  Comprehensive metabolic panel     Status: Abnormal   Collection Time: 07/23/15  5:08 AM  Result Value Ref Range   Sodium 139 135 - 145 mmol/L   Potassium 4.0 3.5 - 5.1 mmol/L   Chloride 109 101 - 111 mmol/L   CO2 23 22 - 32 mmol/L   Glucose, Bld 90 65 - 99 mg/dL   BUN 12 6 - 20 mg/dL   Creatinine, Ser 3.010.77 0.44 - 1.00 mg/dL   Calcium 8.5 (L) 8.9 - 10.3 mg/dL   Total Protein 5.6 (L) 6.5 - 8.1 g/dL   Albumin 2.5 (L) 3.5 - 5.0 g/dL   AST 40 15 - 41 U/L   ALT 32 14 - 54 U/L   Alkaline Phosphatase 93 38 - 126 U/L   Total Bilirubin 0.3 0.3 - 1.2 mg/dL   GFR calc non Af Amer >60 >60 mL/min   GFR calc Af Amer >60 >60 mL/min   Anion gap 7 5 - 15    Physical Exam:  General: alert and cooperative Lochia: appropriate Uterine Fundus: firm Abdomen:  + bowel sounds,  Incision: Honeycomb  dressing CDI DVT Evaluation: No evidence of DVT seen on physical exam. JP drain:   none  Assessment/Plan: Status post cesarean delivery, day 2 Anemia Sudafed for cough  Stable Continue current care. Plan for discharge tomorrow   Alphonzo SeveranceRachel Maliyah Willets MSN, CNM 07/23/2015, 6:19 PM

## 2015-07-23 NOTE — Progress Notes (Signed)
Pt. Has had a cough since before delivery; just had a bout of coughing that lasted 10 to 15 minutes and her pain has increased noticeably because of this.  Spoke with MD regarding patient's cough, received an order for Sudafed.  Patient already taking Robitussin and has Cepacol throat lozenges at her bedside.  Will continue to monitor.  Vivi MartensAshley Bedelia Pong RN

## 2015-07-24 MED ORDER — IBUPROFEN 600 MG PO TABS: 600.0000 mg | ORAL_TABLET | Freq: Four times a day (QID) | ORAL | Status: AC | PRN

## 2015-07-24 MED ORDER — FERROUS SULFATE 325 (65 FE) MG PO TABS: 325.0000 mg | ORAL_TABLET | Freq: Two times a day (BID) | ORAL | Status: AC

## 2015-07-24 NOTE — Lactation Note (Signed)
This note was copied from a baby's chart. Lactation Consultation Note; Mom reports baby has been nursing better- is using NS only on right breast. Baby last fed about 3 hours ago. Suggested waking baby to nurse. Mom agreeable. Able to latch baby without NS to right breast. Mom reports breasts are feeling heavier this morning. Baby nursed for 10 min, then mom had to go to bathroom. Latched to left breast and nursed for 15 min. Came off beast content. Mom reports breasts are feeling softer. Wearing breast shell on right breast- asking if she should continue- encouraged to continue to keep nipple erect for a few more days.. Has not pumped while here in hospital. Has manual pump and plans to call insurance company about a DEBP for home. Reviewed use and cleaning of manual pump- mom states she was very tired when it was explained earlier.. Reviewed engorgement prevention and treatment. Discussed OP appointments and BFSG as resources for support after DC. No questions at present. To call prn  Patient Name: Barbara Stevens WUJWJ'XToday's Date: 07/24/2015 Reason for consult: Follow-up assessment   Maternal Data Formula Feeding for Exclusion: No Has patient been taught Hand Expression?: Yes Does the patient have breastfeeding experience prior to this delivery?: No  Feeding Feeding Type: Breast Fed Length of feed: 25 min  LATCH Score/Interventions Latch: Grasps breast easily, tongue down, lips flanged, rhythmical sucking.  Audible Swallowing: A few with stimulation  Type of Nipple: Everted at rest and after stimulation  Comfort (Breast/Nipple): Soft / non-tender     Hold (Positioning): Assistance needed to correctly position infant at breast and maintain latch. Intervention(s): Breastfeeding basics reviewed;Position options  LATCH Score: 8  Lactation Tools Discussed/Used WIC Program: No Pump Review: Setup, frequency, and cleaning   Consult Status Consult Status: Complete    Pamelia HoitWeeks, Stephaun Million  D 07/24/2015, 9:08 AM

## 2015-07-24 NOTE — Discharge Summary (Signed)
OB Discharge Summary     Patient Name: Barbara Stevens DOB: 1985-06-24 MRN: [id number]  Date of admission: 07/20/2015 Delivering MD: Jaymes Graff   Date of discharge: 07/24/2015  Admitting diagnosis: 38WKS WATER BROKE  Intrauterine pregnancy: [redacted]w[redacted]d     Secondary diagnosis:  Principal Problem:   Status post primary low transverse cesarean section Active Problems:   PROM (premature rupture of membranes)   Rh negative status during pregnancy   Positive GBS test  Additional problems: anemia, Gestational hypertension     Discharge diagnosis: Term Pregnancy Delivered, Gestational Hypertension and Anemia                                                                                                Post partum procedures:Refused Rhogam due to spouse with same blood type  Augmentation: AROM  Complications: ROM>24 hours  Hospital course:  Onset of Labor With Unplanned C/S  30 y.o. yo G1P1001 at [redacted]w[redacted]d was admitted in Latent Labor on 07/20/2015. Patient had a labor course significant for PROM. Membrane Rupture Time/Date: 5:00 AM ,07/20/2015   The patient went for cesarean section due to Arrest of Descent, and delivered a Viable infant,07/21/2015  Details of operation can be found in separate operative note. Patient had an uncomplicated postpartum course.  She is ambulating,tolerating a regular diet, passing flatus, and urinating well.  Patient is discharged home in stable condition 07/24/2015.  Physical exam  Filed Vitals:   07/23/15 0605 07/23/15 1753 07/23/15 2216 07/24/15 0601  BP: 113/53 147/67 144/73 107/74  Pulse: 57 97 92 74  Temp: 97.7 F (36.5 C) 98.2 F (36.8 C)  97.7 F (36.5 C)  TempSrc: Oral Oral  Oral  Resp: Height:      Weight:      SpO2:       General: alert and cooperative Lochia: appropriate Uterine Fundus: firm Incision: Healing well with no significant drainage DVT Evaluation: No evidence of DVT seen on physical exam. Labs: Lab Results  Component  Value Date   WBC 10.8* 07/23/2015   HGB 8.4* 07/23/2015   HCT 24.5* 07/23/2015   MCV 82.8 07/23/2015   PLT 208 07/23/2015   CMP Latest Ref Rng 07/23/2015  Glucose 65 - 99 mg/dL 90  BUN 6 - 20 mg/dL 12  Creatinine 9.14 - 7.82 mg/dL 9.56  Sodium 213 - 086 mmol/L 139  Potassium 3.5 - 5.1 mmol/L 4.0  Chloride 101 - 111 mmol/L 109  CO2 22 - 32 mmol/L 23  Calcium 8.9 - 10.3 mg/dL 5.7(Q)  Total Protein 6.5 - 8.1 g/dL 4.6(N)  Total Bilirubin 0.3 - 1.2 mg/dL 0.3  Alkaline Phos 38 - 126 U/L 93  AST 15 - 41 U/L 40  ALT 14 - 54 U/L 32    Discharge instruction: per After Visit Summary and "Baby and Me Booklet".  After visit meds:    Medication List    STOP taking these medications        HYDROcodone-acetaminophen 5-325 MG tablet  Commonly known as:  NORCO     ipratropium 0.03 % nasal spray  Commonly  known as:  ATROVENT     naproxen 500 MG tablet  Commonly known as:  NAPROSYN     promethazine 25 MG tablet  Commonly known as:  PHENERGAN      TAKE these medications        ferrous sulfate 325 (65 FE) MG tablet  Take 1 tablet (325 mg total) by mouth 2 (two) times daily with a meal.     ibuprofen 600 MG tablet  Commonly known as:  ADVIL,MOTRIN  Take 1 tablet (600 mg total) by mouth every 6 (six) hours as needed.     multivitamin-prenatal 27-0.8 MG Tabs tablet  Take 1 tablet by mouth daily at 12 noon.        Diet: routine diet  Activity: Advance as tolerated. Pelvic rest for 6 weeks.   Outpatient follow up:2-3 days BP check with [facility] RN and 6 week postpartum visit  Follow up Appt:No future appointments. Follow up Visit:No Follow-up on file.  Postpartum contraception: Condoms  Newborn Data: Live born female  Birth Weight: 7 lb 14.6 oz (3590 g) APGAR: 8, 9  Baby Feeding: Breast Disposition:home with mother   07/24/2015 Alphonzo Severanceachel Quintez Maselli, CNM

## 2015-07-24 NOTE — Discharge Instructions (Signed)
Postpartum Care After Cesarean Delivery After you deliver your newborn (postpartum period), the usual stay in the hospital is 24-72 hours. If there were problems with your labor or delivery, or if you have other medical problems, you might be in the hospital longer.  While you are in the hospital, you will receive help and instructions on how to care for yourself and your newborn during the postpartum period.  While you are in the hospital:  It is normal for you to have pain or discomfort from the incision in your abdomen. Be sure to tell your nurses when you are having pain, where the pain is located, and what makes the pain worse.  If you are breastfeeding, you may feel uncomfortable contractions of your uterus for a couple of weeks. This is normal. The contractions help your uterus get back to normal size.  It is normal to have some bleeding after delivery.  For the first 1-3 days after delivery, the flow is red and the amount may be similar to a period.  It is common for the flow to start and stop.  In the first few days, you may pass some small clots. Let your nurses know if you begin to pass large clots or your flow increases.  Do not  flush blood clots down the toilet before having the nurse look at them.  During the next 3-10 days after delivery, your flow should become more watery and pink or brown-tinged in color.  Ten to fourteen days after delivery, your flow should be a small amount of yellowish-white discharge.  The amount of your flow will decrease over the first few weeks after delivery. Your flow may stop in 6-8 weeks. Most women have had their flow stop by 12 weeks after delivery.  You should change your sanitary pads frequently.  Wash your hands thoroughly with soap and water for at least 20 seconds after changing pads, using the toilet, or before holding or feeding your newborn.  Your intravenous (IV) tubing will be removed when you are drinking enough fluids.  The  urine drainage tube (urinary catheter) that was inserted before delivery may be removed within 6-8 hours after delivery or when feeling returns to your legs. You should feel like you need to empty your bladder within the first 6-8 hours after the catheter has been removed.  In case you become weak, lightheaded, or faint, call your nurse before you get out of bed for the first time and before you take a shower for the first time.  Within the first few days after delivery, your breasts may begin to feel tender and full. This is called engorgement. Breast tenderness usually goes away within 48-72 hours after engorgement occurs. You may also notice milk leaking from your breasts. If you are not breastfeeding, do not stimulate your breasts. Breast stimulation can make your breasts produce more milk.  Spending as much time as possible with your newborn is very important. During this time, you and your newborn can feel close and get to know each other. Having your newborn stay in your room (rooming in) will help to strengthen the bond with your newborn. It will give you time to get to know your newborn and become comfortable caring for your newborn.  Your hormones change after delivery. Sometimes the hormone changes can temporarily cause you to feel sad or tearful. These feelings should not last more than a few days. If these feelings last longer than that, you should talk to your  caregiver. °· If desired, talk to your caregiver about methods of family planning or contraception. °· Talk to your caregiver about immunizations. Your caregiver may want you to have the following immunizations before leaving the hospital: °· Tetanus, diphtheria, and pertussis (Tdap) or tetanus and diphtheria (Td) immunization. It is very important that you and your family (including grandparents) or others caring for your newborn are up-to-date with the Tdap or Td immunizations. The Tdap or Td immunization can help protect your newborn  from getting ill. °· Rubella immunization. °· Varicella (chickenpox) immunization. °· Influenza immunization. You should receive this annual immunization if you did not receive the immunization during your pregnancy. °  °This information is not intended to replace advice given to you by your health care provider. Make sure you discuss any questions you have with your health care provider. °  °Document Released: 12/30/2011 Document Reviewed: 12/30/2011 °Elsevier Interactive Patient Education ©2016 [employer] °Breastfeeding °Deciding to breastfeed is one of the best choices you can make for you and your baby. A change in hormones during pregnancy causes your breast tissue to grow and increases the number and size of your milk ducts. These hormones also allow proteins, sugars, and fats from your blood supply to make breast milk in your milk-producing glands. Hormones prevent breast milk from being released before your baby is born as well as prompt milk flow after birth. Once breastfeeding has begun, thoughts of your baby, as well as his or her sucking or crying, can stimulate the release of milk from your milk-producing glands.  °BENEFITS OF BREASTFEEDING °For Your Baby °· Your first milk (colostrum) helps your baby's digestive system function better. °· There are antibodies in your milk that help your baby fight off infections. °· Your baby has a lower incidence of asthma, allergies, and sudden infant death syndrome. °· The nutrients in breast milk are better for your baby than infant formulas and are designed uniquely for your baby's needs. °· Breast milk improves your baby's brain development. °· Your baby is less likely to develop other conditions, such as childhood obesity, asthma, or type 2 diabetes mellitus. °For You °· Breastfeeding helps to create a very special bond between you and your baby. °· Breastfeeding is convenient. Breast milk is always available at the correct temperature and costs  nothing. °· Breastfeeding helps to burn calories and helps you lose the weight gained during pregnancy. °· Breastfeeding makes your uterus contract to its prepregnancy size faster and slows bleeding (lochia) after you give birth.   °· Breastfeeding helps to lower your risk of developing type 2 diabetes mellitus, osteoporosis, and breast or ovarian cancer later in life. °SIGNS THAT YOUR BABY IS HUNGRY °Early Signs of Hunger °· Increased alertness or activity. °· Stretching. °· Movement of the head from side to side. °· Movement of the head and opening of the mouth when the corner of the mouth or cheek is stroked (rooting). °· Increased sucking sounds, smacking lips, cooing, sighing, or squeaking. °· Hand-to-mouth movements. °· Increased sucking of fingers or hands. °Late Signs of Hunger °· Fussing. °· Intermittent crying. °Extreme Signs of Hunger °Signs of extreme hunger will require calming and consoling before your baby will be able to breastfeed successfully. Do not wait for the following signs of extreme hunger to occur before you initiate breastfeeding: °· Restlessness. °· A loud, strong cry. °· Screaming. °BREASTFEEDING BASICS °Breastfeeding Initiation °· Find a comfortable place to sit or lie down, with your neck and back well supported. °· Place   a pillow or rolled up blanket under your baby to bring him or her to the level of your breast (if you are seated). Nursing pillows are specially designed to help support your arms and your baby while you breastfeed.  Make sure that your baby's abdomen is facing your abdomen.  Gently massage your breast. With your fingertips, massage from your chest wall toward your nipple in a circular motion. This encourages milk flow. You may need to continue this action during the feeding if your milk flows slowly.  Support your breast with 4 fingers underneath and your thumb above your nipple. Make sure your fingers are well away from your nipple and your baby's  mouth.  Stroke your baby's lips gently with your finger or nipple.  When your baby's mouth is open wide enough, quickly bring your baby to your breast, placing your entire nipple and as much of the colored area around your nipple (areola) as possible into your baby's mouth.  More areola should be visible above your baby's upper lip than below the lower lip.  Your baby's tongue should be between his or her lower gum and your breast.  Ensure that your baby's mouth is correctly positioned around your nipple (latched). Your baby's lips should create a seal on your breast and be turned out (everted).  It is common for your baby to suck about 2-3 minutes in order to start the flow of breast milk. Latching Teaching your baby how to latch on to your breast properly is very important. An improper latch can cause nipple pain and decreased milk supply for you and poor weight gain in your baby. Also, if your baby is not latched onto your nipple properly, he or she may swallow some air during feeding. This can make your baby fussy. Burping your baby when you switch breasts during the feeding can help to get rid of the air. However, teaching your baby to latch on properly is still the best way to prevent fussiness from swallowing air while breastfeeding. Signs that your baby has successfully latched on to your nipple:  Silent tugging or silent sucking, without causing you pain.  Swallowing heard between every 3-4 sucks.  Muscle movement above and in front of his or her ears while sucking. Signs that your baby has not successfully latched on to nipple:  Sucking sounds or smacking sounds from your baby while breastfeeding.  Nipple pain. If you think your baby has not latched on correctly, slip your finger into the corner of your baby's mouth to break the suction and place it between your baby's gums. Attempt breastfeeding initiation again. Signs of Successful Breastfeeding Signs from your baby:  A  gradual decrease in the number of sucks or complete cessation of sucking.  Falling asleep.  Relaxation of his or her body.  Retention of a small amount of milk in his or her mouth.  Letting go of your breast by himself or herself. Signs from you:  Breasts that have increased in firmness, weight, and size 1-3 hours after feeding.  Breasts that are softer immediately after breastfeeding.  Increased milk volume, as well as a change in milk consistency and color by the fifth day of breastfeeding.  Nipples that are not sore, cracked, or bleeding. Signs That Your [employer] is Getting Enough Milk  Wetting at least 3 diapers in a 24-hour period. The urine should be clear and pale yellow by age 60 days.  At least 3 stools in a 24-hour period by age  5 days. The stool should be soft and yellow. °· At least 3 stools in a 24-hour period by age 7 days. The stool should be seedy and yellow. °· No loss of weight greater than 10% of birth weight during the first 3 days of age. °· Average weight gain of 4-7 ounces (113-198 g) per week after age 4 days. °· Consistent daily weight gain by age 5 days, without weight loss after the age of 2 weeks. °After a feeding, your baby may spit up a small amount. This is common. °BREASTFEEDING FREQUENCY AND DURATION °Frequent feeding will help you make more milk and can prevent sore nipples and breast engorgement. Breastfeed when you feel the need to reduce the fullness of your breasts or when your baby shows signs of hunger. This is called "breastfeeding on demand." Avoid introducing a pacifier to your baby while you are working to establish breastfeeding (the first 4-6 weeks after your baby is born). After this time you may choose to use a pacifier. Research has shown that pacifier use during the first year of a baby's life decreases the risk of sudden infant death syndrome (SIDS). °Allow your baby to feed on each breast as long as he or she wants. Breastfeed until your baby is  finished feeding. When your baby unlatches or falls asleep while feeding from the first breast, offer the second breast. Because newborns are often sleepy in the first few weeks of life, you may need to awaken your baby to get him or her to feed. °Breastfeeding times will vary from baby to baby. However, the following rules can serve as a guide to help you ensure that your baby is properly fed: °· Newborns (babies 4 weeks of age or younger) may breastfeed every 1-3 hours. °· Newborns should not go longer than 3 hours during the day or 5 hours during the night without breastfeeding. °· You should breastfeed your baby a minimum of 8 times in a 24-hour period until you begin to introduce solid foods to your baby at around 6 months of age. °BREAST MILK PUMPING °Pumping and storing breast milk allows you to ensure that your baby is exclusively fed your breast milk, even at times when you are unable to breastfeed. This is especially important if you are going back to work while you are still breastfeeding or when you are not able to be present during feedings. Your lactation consultant can give you guidelines on how long it is safe to store breast milk. °A breast pump is a machine that allows you to pump milk from your breast into a sterile bottle. The pumped breast milk can then be stored in a refrigerator or freezer. Some breast pumps are operated by hand, while others use electricity. Ask your lactation consultant which type will work best for you. Breast pumps can be purchased, but some hospitals and breastfeeding support groups lease breast pumps on a monthly basis. A lactation consultant can teach you how to hand express breast milk, if you prefer not to use a pump. °CARING FOR YOUR BREASTS WHILE YOU BREASTFEED °Nipples can become dry, cracked, and sore while breastfeeding. The following recommendations can help keep your breasts moisturized and healthy: °· Avoid using soap on your nipples. °· Wear a supportive bra.  Although not required, special nursing bras and tank tops are designed to allow access to your breasts for breastfeeding without taking off your entire bra or top. Avoid wearing underwire-style bras or extremely tight bras. °· Air dry   your nipples for 3-62mnutes after each feeding.  Use only cotton bra pads to absorb leaked breast milk. Leaking of breast milk between feedings is normal.  Use lanolin on your nipples after breastfeeding. Lanolin helps to maintain your skin's normal moisture barrier. If you use pure lanolin, you do not need to wash it off before feeding your baby again. Pure lanolin is not toxic to your baby. You may also hand express a few drops of breast milk and gently massage that milk into your nipples and allow the milk to air dry. In the first few weeks after giving birth, some women experience extremely full breasts (engorgement). Engorgement can make your breasts feel heavy, warm, and tender to the touch. Engorgement peaks within 3-5 days after you give birth. The following recommendations can help ease engorgement:  Completely empty your breasts while breastfeeding or pumping. You may want to start by applying warm, moist heat (in the shower or with warm water-soaked hand towels) just before feeding or pumping. This increases circulation and helps the milk flow. If your baby does not completely empty your breasts while breastfeeding, pump any extra milk after he or she is finished.  Wear a snug bra (nursing or regular) or tank top for 1-2 days to signal your body to slightly decrease milk production.  Apply ice packs to your breasts, unless this is too uncomfortable for you.  Make sure that your baby is latched on and positioned properly while breastfeeding. If engorgement persists after 48 hours of following these recommendations, contact your health care provider or a lScience writer OVERALL HEALTH CARE RECOMMENDATIONS WHILE BREASTFEEDING  Eat healthy foods.  Alternate between meals and snacks, eating 3 of each per day. Because what you eat affects your breast milk, some of the foods may make your baby more irritable than usual. Avoid eating these foods if you are sure that they are negatively affecting your baby.  Drink milk, fruit juice, and water to satisfy your thirst (about 10 glasses a day).  Rest often, relax, and continue to take your prenatal vitamins to prevent fatigue, stress, and anemia.  Continue breast self-awareness checks.  Avoid chewing and smoking tobacco. Chemicals from cigarettes that pass into breast milk and exposure to secondhand smoke may harm your baby.  Avoid alcohol and drug use, including marijuana. Some medicines that may be harmful to your baby can pass through breast milk. It is important to ask your health care provider before taking any medicine, including all over-the-counter and prescription medicine as well as vitamin and herbal supplements. It is possible to become pregnant while breastfeeding. If birth control is desired, ask your health care provider about options that will be safe for your baby. SEEK MEDICAL CARE IF:  You feel like you want to stop breastfeeding or have become frustrated with breastfeeding.  You have painful breasts or nipples.  Your nipples are cracked or bleeding.  Your breasts are red, tender, or warm.  You have a swollen area on either breast.  You have a fever or chills.  You have nausea or vomiting.  You have drainage other than breast milk from your nipples.  Your breasts do not become full before feedings by the fifth day after you give birth.  You feel sad and depressed.  Your baby is too sleepy to eat well.  Your baby is having trouble sleeping.   Your baby is wetting less than 3 diapers in a 24-hour period.  Your baby has less than 3 stools in  a 24-hour period.  Your baby's skin or the white part of his or her eyes becomes yellow.   Your baby is not gaining  weight by 30 days of age. SEEK IMMEDIATE MEDICAL CARE IF:  Your baby is overly tired (lethargic) and does not want to wake up and feed.  Your baby develops an unexplained fever.   This information is not intended to replace advice given to you by your health care provider. Make sure you discuss any questions you have with your health care provider.   Document Released: 04/06/2005 Document Revised: 12/26/2014 Document Reviewed: 09/28/2012 Elsevier Interactive Patient Education 2016 [employer]. Iron-Rich Diet Iron is a mineral that helps your body to produce hemoglobin. Hemoglobin is a protein in your red blood cells that carries oxygen to your body's tissues. Eating too little iron may cause you to feel weak and tired, and it can increase your risk for infection. Eating enough iron is necessary for your body's metabolism, muscle function, and nervous system. Iron is naturally found in many foods. It can also be added to foods or fortified in foods. There are two types of dietary iron:  Heme iron. Heme iron is absorbed by the body more easily than nonheme iron. Heme iron is found in meat, poultry, and fish.  Nonheme iron. Nonheme iron is found in dietary supplements, iron-fortified grains, beans, and vegetables. You may need to follow an iron-rich diet if:  You have been diagnosed with iron deficiency or iron-deficiency anemia.  You have a condition that prevents you from absorbing dietary iron, such as:  Infection in your intestines.  Celiac disease. This involves long-lasting (chronic) inflammation of your intestines.  You do not eat enough iron.  You eat a diet that is high in foods that impair iron absorption.  You have lost a lot of blood.  You have heavy bleeding during your menstrual cycle.  You are pregnant. WHAT IS MY PLAN? Your health care provider may help you to determine how much iron you need per day based on your condition. Generally, when a person consumes  sufficient amounts of iron in the diet, the following iron needs are met:  Men.  5-24 years old: 11 mg per day.  73-25 years old: 8 mg per day.  Women.   24-44 years old: 15 mg per day.  33-82 years old: 18 mg per day.  Over 69 years old: 8 mg per day.  Pregnant women: 27 mg per day.  Breastfeeding women: 9 mg per day. WHAT DO I NEED TO KNOW ABOUT AN IRON-RICH DIET?  Eat fresh fruits and vegetables that are high in vitamin C along with foods that are high in iron. This will help increase the amount of iron that your body absorbs from food, especially with foods containing nonheme iron. Foods that are high in vitamin C include oranges, peppers, tomatoes, and mango.  Take iron supplements only as directed by your health care provider. Overdose of iron can be life-threatening. If you were prescribed iron supplements, take them with orange juice or a vitamin C supplement.  Cook foods in pots and pans that are made from iron.   Eat nonheme iron-containing foods alongside foods that are high in heme iron. This helps to improve your iron absorption.   Certain foods and drinks contain compounds that impair iron absorption. Avoid eating these foods in the same meal as iron-rich foods or with iron supplements. These include:  Coffee, black tea, and red wine.  Milk, dairy  products, and foods that are high in calcium.  Beans, soybeans, and peas.  Whole grains.  When eating foods that contain both nonheme iron and compounds that impair iron absorption, follow these tips to absorb iron better.   Soak beans overnight before cooking.  Soak whole grains overnight and drain them before using.  Ferment flours before baking, such as using yeast in bread dough. WHAT FOODS CAN I EAT? Grains Iron-fortified breakfast cereal. Iron-fortified whole-wheat bread. Enriched rice. Sprouted grains. Vegetables Spinach. Potatoes with skin. Green peas. Broccoli. Red and green bell peppers.  Fermented vegetables. Fruits Prunes. Raisins. Oranges. Strawberries. Mango. Grapefruit. Meats and Other Protein Sources Beef liver. Oysters. Beef. Shrimp. [place]. Chicken. [place]. Sardines. Chickpeas. Nuts. Tofu. Beverages Tomato juice. Fresh orange juice. Prune juice. Hibiscus tea. Fortified instant breakfast shakes. Condiments Tahini. Fermented soy sauce. Sweets and Desserts Black-strap molasses.  Other Wheat germ. The items listed above may not be a complete list of recommended foods or beverages. Contact your dietitian for more options. WHAT FOODS ARE NOT RECOMMENDED? Grains Whole grains. Bran cereal. Bran flour. Oats. Vegetables Artichokes. Brussels sprouts. Kale. Fruits Blueberries. Raspberries. Strawberries. Figs. Meats and Other Protein Sources Soybeans. Products made from soy protein. Dairy Milk. Cream. Cheese. Yogurt. Cottage cheese. Beverages Coffee. Black tea. Red wine. Sweets and Desserts Cocoa. Chocolate. Ice cream. Other Basil. Oregano. Parsley. The items listed above may not be a complete list of foods and beverages to avoid. Contact your dietitian for more information.   This information is not intended to replace advice given to you by your health care provider. Make sure you discuss any questions you have with your health care provider.   Document Released: 11/18/2004 Document Revised: 04/27/2014 Document Reviewed: 11/01/2013 Elsevier Interactive Patient Education 2016 [employer]. Postpartum Depression and Baby Blues The postpartum period begins right after the birth of a baby. During this time, there is often a great amount of joy and excitement. It is also a time of many changes in the life of the parents. Regardless of how many times a mother gives birth, each child brings new challenges and dynamics to the family. It is not unusual to have feelings of excitement along with confusing shifts in moods, emotions, and thoughts. All mothers are  at risk of developing postpartum depression or the "baby blues." These mood changes can occur right after giving birth, or they may occur many months after giving birth. The baby blues or postpartum depression can be mild or severe. Additionally, postpartum depression can go away rather quickly, or it can be a long-term condition.  CAUSES Raised hormone levels and the rapid drop in those levels are thought to be a main cause of postpartum depression and the baby blues. A number of hormones change during and after pregnancy. Estrogen and progesterone usually decrease right after the delivery of your baby. The levels of thyroid hormone and various cortisol steroids also rapidly drop. Other factors that play a role in these mood changes include major life events and genetics.  RISK FACTORS If you have any of the following risks for the baby blues or postpartum depression, know what symptoms to watch out for during the postpartum period. Risk factors that may increase the likelihood of getting the baby blues or postpartum depression include:  Having a personal or family history of depression.   Having depression while being pregnant.   Having premenstrual mood issues or mood issues related to oral contraceptives.  Having a lot of life stress.   Having marital conflict.  Lacking a social support network.   Having a baby with special needs.   Having health problems, such as diabetes.  SIGNS AND SYMPTOMS Symptoms of baby blues include:  Brief changes in mood, such as going from extreme happiness to sadness.  Decreased concentration.   Difficulty sleeping.   Crying spells, tearfulness.   Irritability.   Anxiety.  Symptoms of postpartum depression typically begin within the first month after giving birth. These symptoms include:  Difficulty sleeping or excessive sleepiness.   Marked weight loss.   Agitation.   Feelings of worthlessness.   Lack of interest in  activity or food.  Postpartum psychosis is a very serious condition and can be dangerous. Fortunately, it is rare. Displaying any of the following symptoms is cause for immediate medical attention. Symptoms of postpartum psychosis include:   Hallucinations and delusions.   Bizarre or disorganized behavior.   Confusion or disorientation.  DIAGNOSIS  A diagnosis is made by an evaluation of your symptoms. There are no medical or lab tests that lead to a diagnosis, but there are various questionnaires that a health care provider may use to identify those with the baby blues, postpartum depression, or psychosis. Often, a screening tool called the [place] Postnatal Depression Scale is used to diagnose depression in the postpartum period.  TREATMENT The baby blues usually goes away on its own in 1-2 weeks. Social support is often all that is needed. You will be encouraged to get adequate sleep and rest. Occasionally, you may be given medicines to help you sleep.  Postpartum depression requires treatment because it can last several months or longer if it is not treated. Treatment may include individual or group therapy, medicine, or both to address any social, physiological, and psychological factors that may play a role in the depression. Regular exercise, a healthy diet, rest, and social support may also be strongly recommended.  Postpartum psychosis is more serious and needs treatment right away. Hospitalization is often needed. HOME CARE INSTRUCTIONS  Get as much rest as you can. Nap when the baby sleeps.   Exercise regularly. Some women find yoga and walking to be beneficial.   Eat a balanced and nourishing diet.   Do little things that you enjoy. Have a cup of tea, take a bubble bath, read your favorite magazine, or listen to your favorite music.  Avoid alcohol.   Ask for help with household chores, cooking, grocery shopping, or running errands as needed. Do not try to do  everything.   Talk to people close to you about how you are feeling. Get support from your partner, family members, friends, or other new moms.  Try to stay positive in how you think. Think about the things you are grateful for.   Do not spend a lot of time alone.   Only take over-the-counter or prescription medicine as directed by your health care provider.  Keep all your postpartum appointments.   Let your health care provider know if you have any concerns.  SEEK MEDICAL CARE IF: You are having a reaction to or problems with your medicine. SEEK IMMEDIATE MEDICAL CARE IF:  You have suicidal feelings.   You think you may harm the baby or someone else. MAKE SURE YOU:  Understand these instructions.  Will watch your condition.  Will get help right away if you are not doing well or get worse.   This information is not intended to replace advice given to you by your health care provider. Make sure you  discuss any questions you have with your health care provider.   Document Released: 01/09/2004 Document Revised: 04/11/2013 Document Reviewed: 01/16/2013 Elsevier Interactive Patient Education [employer].

## 2018-05-31 ENCOUNTER — Encounter (HOSPITAL_COMMUNITY): Payer: Self-pay | Admitting: Emergency Medicine

## 2018-05-31 ENCOUNTER — Emergency Department (HOSPITAL_COMMUNITY): Payer: BC Managed Care – PPO

## 2018-05-31 ENCOUNTER — Emergency Department (HOSPITAL_COMMUNITY)
Admission: EM | Admit: 2018-05-31 | Discharge: 2018-05-31 | Disposition: A | Discharge status: Home / Self Care | Payer: BC Managed Care – PPO | Attending: Emergency Medicine | Admitting: Emergency Medicine

## 2018-05-31 ENCOUNTER — Other Ambulatory Visit: Payer: Self-pay

## 2018-05-31 DIAGNOSIS — N898 Other specified noninflammatory disorders of vagina: Secondary | ICD-10-CM | POA: Insufficient documentation

## 2018-05-31 DIAGNOSIS — R1031 Right lower quadrant pain: Secondary | ICD-10-CM | POA: Insufficient documentation

## 2018-05-31 DIAGNOSIS — R1030 Lower abdominal pain, unspecified: Secondary | ICD-10-CM | POA: Insufficient documentation

## 2018-05-31 LAB — URINALYSIS, ROUTINE W REFLEX MICROSCOPIC
Bilirubin Urine: NEGATIVE
Glucose, UA: NEGATIVE mg/dL
KETONES UR: NEGATIVE mg/dL
LEUKOCYTE UA: NEGATIVE
Nitrite: NEGATIVE
PROTEIN: NEGATIVE mg/dL
Specific Gravity, Urine: 1.018 (ref 1.005–1.030)
pH: 8 (ref 5.0–8.0)

## 2018-05-31 LAB — WET PREP, GENITAL
CLUE CELLS WET PREP: NONE SEEN
Sperm: NONE SEEN
Trich, Wet Prep: NONE SEEN
Yeast Wet Prep HPF POC: NONE SEEN

## 2018-05-31 LAB — CBC
HEMATOCRIT: 42.8 % (ref 36.0–46.0)
HEMOGLOBIN: 13.9 g/dL (ref 12.0–15.0)
MCH: 27 pg (ref 26.0–34.0)
MCHC: 32.5 g/dL (ref 30.0–36.0)
MCV: 83.3 fL (ref 80.0–100.0)
NRBC: 0 % (ref 0.0–0.2)
Platelets: 361 10*3/uL (ref 150–400)
RBC: 5.14 MIL/uL — AB (ref 3.87–5.11)
RDW: 12.5 % (ref 11.5–15.5)
WBC: 6.1 10*3/uL (ref 4.0–10.5)

## 2018-05-31 LAB — I-STAT BETA HCG BLOOD, ED (MC, WL, AP ONLY)

## 2018-05-31 LAB — COMPREHENSIVE METABOLIC PANEL
ALT: 20 U/L (ref 0–44)
AST: 18 U/L (ref 15–41)
Albumin: 4.7 g/dL (ref 3.5–5.0)
Alkaline Phosphatase: 56 U/L (ref 38–126)
Anion gap: 9 (ref 5–15)
BUN: 13 mg/dL (ref 6–20)
CHLORIDE: 107 mmol/L (ref 98–111)
CO2: 23 mmol/L (ref 22–32)
CREATININE: 0.77 mg/dL (ref 0.44–1.00)
Calcium: 9.4 mg/dL (ref 8.9–10.3)
GFR calc non Af Amer: >60 mL/min (ref >60)
Glucose, Bld: 95 mg/dL (ref 70–99)
POTASSIUM: 3.7 mmol/L (ref 3.5–5.1)
SODIUM: 139 mmol/L (ref 135–145)
Total Bilirubin: 1.2 mg/dL (ref 0.3–1.2)
Total Protein: 8 g/dL (ref 6.5–8.1)

## 2018-05-31 LAB — LIPASE, BLOOD: LIPASE: 35 U/L (ref 11–51)

## 2018-05-31 MED ORDER — IOPAMIDOL (ISOVUE-300) INJECTION 61%
INTRAVENOUS | Status: AC
  Filled 2018-05-31: qty 100

## 2018-05-31 MED ORDER — MORPHINE SULFATE (PF) 4 MG/ML IV SOLN
4.0000 mg | Freq: Once | INTRAVENOUS | Status: AC
  Administered 2018-05-31: 4 mg via INTRAVENOUS
  Filled 2018-05-31: qty 1

## 2018-05-31 MED ORDER — ONDANSETRON HCL 4 MG/2ML IJ SOLN
4.0000 mg | Freq: Once | INTRAMUSCULAR | Status: AC
  Administered 2018-05-31: 4 mg via INTRAVENOUS
  Filled 2018-05-31: qty 2

## 2018-05-31 MED ORDER — ONDANSETRON 4 MG PO TBDP: 4.0000 mg | ORAL_TABLET | Freq: Three times a day (TID) | ORAL | 0 refills | Status: AC | PRN

## 2018-05-31 MED ORDER — IOPAMIDOL (ISOVUE-300) INJECTION 61%
100.0000 mL | Freq: Once | INTRAVENOUS | Status: AC | PRN
  Administered 2018-05-31: 100 mL via INTRAVENOUS

## 2018-05-31 MED ORDER — SODIUM CHLORIDE (PF) 0.9 % IJ SOLN
INTRAMUSCULAR | Status: AC
  Filled 2018-05-31: qty 50

## 2018-05-31 MED ORDER — SODIUM CHLORIDE 0.9 % IV BOLUS
1000.0000 mL | Freq: Once | INTRAVENOUS | Status: AC
  Administered 2018-05-31: 1000 mL via INTRAVENOUS

## 2018-05-31 NOTE — ED Provider Notes (Signed)
[facility] Provider Note   CSN: [id number] Arrival date & time: 05/31/18  1234     History   Chief Complaint Chief Complaint  Patient presents with  . Abdominal Pain  . Nausea    HPI Barbara Stevens is a 33 y.o. female.  HPI   Barbara RotundaSamantha Stevens is a 33 y.o. female, patient with no pertinent past medical history, presenting to the ED with abdominal pain for the last two days. Pain was originally periumbilical and burning, moved to RLQ and became sharp yesterday afternoon, currently rated 7/10, radiating to the lower back, towards the umbilicus, and towards the suprapubic region.  Accompanied by nausea, anorexia, bloating, chills, and constipation. Last BM was 2-3 days ago, prior to pain onset. Last food 8 pm last night.   Denies fever, urinary symptoms, abnormal vaginal discharge/bleeding, diarrhea, hematochezia/melena, vomiting, or any other complaints.   Past Medical History:  Diagnosis Date  . Medical history non-contributory     Patient Active Problem List   Diagnosis Date Noted  . Status post primary low transverse cesarean section 07/23/2015  . Rh negative status during pregnancy 07/23/2015  . Positive GBS test 07/23/2015  . PROM (premature rupture of membranes) 07/20/2015    Past Surgical History:  Procedure Laterality Date  . CESAREAN SECTION N/A 07/21/2015   Procedure: CESAREAN SECTION;  Surgeon: Jaymes GraffNaima Dillard, MD;  Location: [facility];  Service: Obstetrics;  Laterality: N/A;  . NO PAST SURGERIES       OB History    Gravida  1   Para  1   Term  1   Preterm      AB      Living  1     SAB      TAB      Ectopic      Multiple  0   Live Births  1            Home Medications    Prior to Admission medications   Medication Sig Start Date End Date Taking? Authorizing Provider  ibuprofen (ADVIL,MOTRIN) 200 MG tablet Take 400 mg by mouth daily as needed for mild pain.   Yes [provider]  ferrous  sulfate 325 (65 FE) MG tablet Take 1 tablet (325 mg total) by mouth 2 (two) times daily with a meal. Patient not taking: Reported on 05/31/2018 07/24/15   Alphonzo SeveranceStall, Rachel, CNM  ibuprofen (ADVIL,MOTRIN) 600 MG tablet Take 1 tablet (600 mg total) by mouth every 6 (six) hours as needed. Patient not taking: Reported on 05/31/2018 07/24/15   Alphonzo SeveranceStall, Rachel, CNM  ondansetron (ZOFRAN ODT) 4 MG disintegrating tablet Take 1 tablet (4 mg total) by mouth every 8 (eight) hours as needed for nausea or vomiting. 05/31/18   , Hillard DankerShawn C, PA-C    Family History Family History  Problem Relation Age of Onset  . Alcohol abuse Neg Hx   . Arthritis Neg Hx   . Asthma Neg Hx   . Birth defects Neg Hx   . Cancer Neg Hx   . COPD Neg Hx   . Depression Neg Hx   . Diabetes Neg Hx   . Drug abuse Neg Hx   . Early death Neg Hx   . Hearing loss Neg Hx   . Heart disease Neg Hx   . Hyperlipidemia Neg Hx   . Hypertension Neg Hx   . Kidney disease Neg Hx   . Learning disabilities Neg Hx   . Mental illness Neg  Hx   . Mental retardation Neg Hx   . Miscarriages / Stillbirths Neg Hx   . Stroke Neg Hx   . Vision loss Neg Hx   . Varicose Veins Neg Hx     Social History Social History   Tobacco Use  . Smoking status: Never Smoker  . Smokeless tobacco: Never Used  Substance Use Topics  . Alcohol use: No    Comment: occ  . Drug use: No     Allergies   Patient has no known allergies.   Review of Systems Review of Systems  Constitutional: Positive for chills. Negative for fever.  Respiratory: Negative for shortness of breath.   Cardiovascular: Negative for chest pain.  Gastrointestinal: Positive for abdominal pain, constipation and nausea. Negative for blood in stool, diarrhea and vomiting.  Genitourinary: Negative for dysuria, frequency, hematuria, vaginal bleeding and vaginal discharge.  Neurological: Negative for weakness.  All other systems reviewed and are negative.    Physical Exam Updated Vital  Signs BP 136/62   Pulse 80   Temp 98.8 F (37.1 C) (Oral)   Resp 18   Ht 5\' 5"  (1.651 m)   Wt 90.7 kg   LMP 05/24/2018   SpO2 99%   BMI 33.28 kg/m   Physical Exam Vitals signs and nursing note reviewed.  Constitutional:      General: She is not in acute distress.    Appearance: She is well-developed. She is not diaphoretic.  HENT:     Head: Normocephalic and atraumatic.     Mouth/Throat:     Mouth: Mucous membranes are moist.     Pharynx: Oropharynx is clear.  Eyes:     Conjunctiva/sclera: Conjunctivae normal.  Neck:     Musculoskeletal: Neck supple.  Cardiovascular:     Rate and Rhythm: Normal rate and regular rhythm.     Pulses: Normal pulses.     Heart sounds: Normal heart sounds.  Pulmonary:     Effort: Pulmonary effort is normal. No respiratory distress.     Breath sounds: Normal breath sounds.  Abdominal:     General: Bowel sounds are normal.     Palpations: Abdomen is soft.     Tenderness: There is abdominal tenderness. There is rebound. There is no guarding.       Comments: Palpation of the suprapubic and periumbilical regions cause pain in the RLQ.  Genitourinary:    Comments: External genitalia normal Vagina with discharge - Small amount of brown discharge Cervix  - Appeared normal, but not completely able to be visualized negative for cervical motion tenderness Adnexa palpated, no masses, negative for tenderness noted Bladder palpated negative for tenderness Uterus palpated no masses, positive for tenderness  No inguinal lymphadenopathy. Otherwise normal female genitalia. PA, [facility], served as chaperone during exam. Musculoskeletal:     Right lower leg: No edema.     Left lower leg: No edema.  Lymphadenopathy:     Cervical: No cervical adenopathy.  Skin:    General: Skin is warm and dry.  Neurological:     Mental Status: She is alert.  Psychiatric:        Mood and Affect: Mood and affect normal.        Speech: Speech normal.        Behavior:  Behavior normal.      ED Treatments / Results  Labs (all labs ordered are listed, but only abnormal results are displayed) Labs Reviewed  WET PREP, GENITAL - Abnormal; Notable for the following components:  Result Value   WBC, Wet Prep HPF POC MANY (*)    All other components within normal limits  CBC - Abnormal; Notable for the following components:   RBC 5.14 (*)    All other components within normal limits  URINALYSIS, ROUTINE W REFLEX MICROSCOPIC - Abnormal; Notable for the following components:   Hgb urine dipstick SMALL (*)    Bacteria, UA RARE (*)    All other components within normal limits  LIPASE, BLOOD  COMPREHENSIVE METABOLIC PANEL  RPR  HIV ANTIBODY (ROUTINE TESTING W REFLEX)  I-STAT BETA HCG BLOOD, ED ([facility], [facility], [facility] ONLY)  GC/CHLAMYDIA PROBE AMP ([place] NOT [facility]    EKG None  Radiology [place] Transvaginal Non-ob  Result Date: 05/31/2018 CLINICAL DATA:  Acute onset of right pelvic pain and tenderness. Clinical suspicion for ovarian torsion. EXAM: TRANSABDOMINAL AND TRANSVAGINAL ULTRASOUND OF PELVIS DOPPLER ULTRASOUND OF OVARIES TECHNIQUE: Both transabdominal and transvaginal ultrasound examinations of the pelvis were performed. Transabdominal technique was performed for global imaging of the pelvis including uterus, ovaries, adnexal regions, and pelvic cul-de-sac. It was necessary to proceed with endovaginal exam following the transabdominal exam to visualize the ovaries. Color and duplex Doppler ultrasound was utilized to evaluate blood flow to the ovaries. COMPARISON:  CT on 05/31/2018 FINDINGS: Uterus Measurements: 5.8 x 4.0 x 4.8 cm = volume: 58 mL. Retroflexed. A small subserosal fibroid is seen in the posterior corpus measuring 1.4 cm. A 2nd small fibroid is seen in the fundal region measuring 1.7 cm. Endometrium Thickness: 2 mm.  No focal abnormality visualized. Right ovary Measurements: 3.0 x 2.1 x 2.3 cm = volume: 7.5 mL. Normal appearance/no adnexal mass.  Left ovary Measurements: 3.2 x 1.7 x 1.8 cm = volume: 5.1 mL. Normal appearance/no adnexal mass. Pulsed Doppler evaluation of both ovaries demonstrates normal low-resistance arterial and venous waveforms. Other findings No abnormal free fluid. IMPRESSION: Two small uterine fibroids, largest measuring 1.7 cm. Normal appearance of both ovaries.  No adnexal mass identified. No sonographic evidence for ovarian torsion. Electronically Signed   By: Myles RosenthalJohn  Stahl M.D.   On: 05/31/2018 17:11   [place] Pelvis Complete  Result Date: 05/31/2018 CLINICAL DATA:  Acute onset of right pelvic pain and tenderness. Clinical suspicion for ovarian torsion. EXAM: TRANSABDOMINAL AND TRANSVAGINAL ULTRASOUND OF PELVIS DOPPLER ULTRASOUND OF OVARIES TECHNIQUE: Both transabdominal and transvaginal ultrasound examinations of the pelvis were performed. Transabdominal technique was performed for global imaging of the pelvis including uterus, ovaries, adnexal regions, and pelvic cul-de-sac. It was necessary to proceed with endovaginal exam following the transabdominal exam to visualize the ovaries. Color and duplex Doppler ultrasound was utilized to evaluate blood flow to the ovaries. COMPARISON:  CT on 05/31/2018 FINDINGS: Uterus Measurements: 5.8 x 4.0 x 4.8 cm = volume: 58 mL. Retroflexed. A small subserosal fibroid is seen in the posterior corpus measuring 1.4 cm. A 2nd small fibroid is seen in the fundal region measuring 1.7 cm. Endometrium Thickness: 2 mm.  No focal abnormality visualized. Right ovary Measurements: 3.0 x 2.1 x 2.3 cm = volume: 7.5 mL. Normal appearance/no adnexal mass. Left ovary Measurements: 3.2 x 1.7 x 1.8 cm = volume: 5.1 mL. Normal appearance/no adnexal mass. Pulsed Doppler evaluation of both ovaries demonstrates normal low-resistance arterial and venous waveforms. Other findings No abnormal free fluid. IMPRESSION: Two small uterine fibroids, largest measuring 1.7 cm. Normal appearance of both ovaries.  No adnexal mass  identified. No sonographic evidence for ovarian torsion. Electronically Signed   By: Alver SorrowJohn  Stahl M.D.  On: 05/31/2018 17:11   Ct Abdomen Pelvis W Contrast  Result Date: 05/31/2018 CLINICAL DATA:  Right abdomen pain with nausea EXAM: CT ABDOMEN AND PELVIS WITH CONTRAST TECHNIQUE: Multidetector CT imaging of the abdomen and pelvis was performed using the standard protocol following bolus administration of intravenous contrast. CONTRAST:  ISOVUE-300 IOPAMIDOL (ISOVUE-300) INJECTION 61% COMPARISON:  July 22, 2016 FINDINGS: Lower chest: No acute abnormality. Hepatobiliary: Liver is prominent measuring 18.2 cm in length unchanged. No focal liver lesion is identified. Gallbladder is normal. The biliary tree is normal. Pancreas: Unremarkable. No pancreatic ductal dilatation or surrounding inflammatory changes. Spleen: Normal in size without focal abnormality. Adrenals/Urinary Tract: Adrenal glands are unremarkable. Kidneys are normal, without renal calculi, focal lesion, or hydronephrosis. Bladder is unremarkable. Stomach/Bowel: Stomach is within normal limits. Appendix appears normal. No evidence of bowel wall thickening, distention, or inflammatory changes. Vascular/Lymphatic: No significant vascular findings are present. No enlarged abdominal or pelvic lymph nodes. Reproductive: Uterus and bilateral adnexa are unremarkable. Other: No abdominal wall hernia or abnormality. No abdominopelvic ascites. Musculoskeletal: No acute abnormality identified. IMPRESSION: No acute abnormality identified in the abdomen and pelvis. The appendix is normal. There is no bowel obstruction. Electronically Signed   By: Sherian Rein M.D.   On: 05/31/2018 15:17   [place] Art/ven Flow Abd Pelv Doppler  Result Date: 05/31/2018 CLINICAL DATA:  Acute onset of right pelvic pain and tenderness. Clinical suspicion for ovarian torsion. EXAM: TRANSABDOMINAL AND TRANSVAGINAL ULTRASOUND OF PELVIS DOPPLER ULTRASOUND OF OVARIES TECHNIQUE: Both  transabdominal and transvaginal ultrasound examinations of the pelvis were performed. Transabdominal technique was performed for global imaging of the pelvis including uterus, ovaries, adnexal regions, and pelvic cul-de-sac. It was necessary to proceed with endovaginal exam following the transabdominal exam to visualize the ovaries. Color and duplex Doppler ultrasound was utilized to evaluate blood flow to the ovaries. COMPARISON:  CT on 05/31/2018 FINDINGS: Uterus Measurements: 5.8 x 4.0 x 4.8 cm = volume: 58 mL. Retroflexed. A small subserosal fibroid is seen in the posterior corpus measuring 1.4 cm. A 2nd small fibroid is seen in the fundal region measuring 1.7 cm. Endometrium Thickness: 2 mm.  No focal abnormality visualized. Right ovary Measurements: 3.0 x 2.1 x 2.3 cm = volume: 7.5 mL. Normal appearance/no adnexal mass. Left ovary Measurements: 3.2 x 1.7 x 1.8 cm = volume: 5.1 mL. Normal appearance/no adnexal mass. Pulsed Doppler evaluation of both ovaries demonstrates normal low-resistance arterial and venous waveforms. Other findings No abnormal free fluid. IMPRESSION: Two small uterine fibroids, largest measuring 1.7 cm. Normal appearance of both ovaries.  No adnexal mass identified. No sonographic evidence for ovarian torsion. Electronically Signed   By: Myles Rosenthal M.D.   On: 05/31/2018 17:11    Procedures Pelvic exam Date/Time: 05/31/2018 5:50 PM Performed by: Anselm Pancoast, PA-C Authorized by: Anselm Pancoast, PA-C  Consent: Verbal consent obtained. Risks and benefits: risks, benefits and alternatives were discussed Consent given by: patient Patient identity confirmed: verbally with patient and provided demographic data Local anesthesia used: no  Anesthesia: Local anesthesia used: no  Sedation: Patient sedated: no  Patient tolerance: Patient tolerated the procedure well with no immediate complications    (including critical care time)  Medications Ordered in ED Medications    iopamidol (ISOVUE-300) 61 % injection (has no administration in time range)  sodium chloride (PF) 0.9 % injection (has no administration in time range)  sodium chloride 0.9 % bolus 1,000 mL (0 mLs Intravenous Stopped 05/31/18 1549)  ondansetron (ZOFRAN) injection 4 mg (  4 mg Intravenous Given 05/31/18 1448)  morphine 4 MG/ML injection 4 mg (4 mg Intravenous Given 05/31/18 1448)  iopamidol (ISOVUE-300) 61 % injection 100 mL (100 mLs Intravenous Contrast Given 05/31/18 1451)     Initial Impression / Assessment and Plan / ED Course  I have reviewed the triage vital signs and the nursing notes.  Pertinent labs & imaging results that were available during my care of the patient were reviewed by me and considered in my medical decision making (see chart for details).  Clinical Course as of May 31 1953  Tue May 31, 2018  1545 Discussed lab and CT results with the patient.  She states her pain has improved.  We discussed next steps for assessment.   [SJ]    Clinical Course User Index [SJ] ,  C, PA-C    Patient presents with abdominal pain and other symptoms that give suspicion for appendicitis. Patient is nontoxic appearing, afebrile, not tachycardic, not tachypneic, not hypotensive, maintains excellent SPO2 on room air, and is in no apparent distress.  No leukocytosis.  Negative pregnancy test.  Other lab results reassuring.  No acute abnormality noted on CT, including no evidence of appendicitis.  Ultrasound with small uterine fibroids, but no adnexal abnormalities.  She will follow-up with her PCP/OB/GYN. The patient was given instructions for home care as well as return precautions. Patient voices understanding of these instructions, accepts the plan, and is comfortable with discharge.  Vitals:   05/31/18 1359 05/31/18 1530 05/31/18 1550 05/31/18 1601  BP: 136/62 132/79 132/79 130/71  Pulse: 80 68 68 77  Resp: 18  18 16   Temp:      TempSrc:      SpO2: 99% 99% 99% 100%  Weight:       Height:         Final Clinical Impressions(s) / ED Diagnoses   Final diagnoses:  Right lower quadrant abdominal pain    ED Discharge Orders         Ordered    ondansetron (ZOFRAN ODT) 4 MG disintegrating tablet  Every 8 hours PRN     05/31/18 1930           Concepcion Living 05/31/18 1959    Mancel Bale, MD 06/01/18 1531

## 2018-05-31 NOTE — ED Notes (Signed)
Pelvic cart at pt's bedside. 

## 2018-05-31 NOTE — ED Triage Notes (Signed)
Pt sent from [facility] for right side abd pains with nausea x 3 days.

## 2018-05-31 NOTE — ED Notes (Signed)
Patient given PO fluids, per PA.

## 2018-05-31 NOTE — Discharge Instructions (Signed)
Please follow-up with OB/GYN and primary care provider on this matter. Nausea/vomiting: Use the ondansetron (generic for Zofran) for nausea or vomiting.  This medication may not prevent all vomiting or nausea, but can help facilitate better hydration. Things that can help with nausea/vomiting also include peppermint/menthol candies, vitamin B12, and ginger. Antiinflammatory medications: Take 600 mg of ibuprofen every 6 hours or 440 mg (over the counter dose) to 500 mg (prescription dose) of naproxen every 12 hours for the next 3 days. After this time, these medications may be used as needed for pain. Take these medications with food to avoid upset stomach. Choose only one of these medications, do not take them together. Acetaminophen (generic for Tylenol): Should you continue to have additional pain while taking the ibuprofen or naproxen, you may add in acetaminophen as needed. Your daily total maximum amount of acetaminophen from all sources should be limited to 4000mg /day for persons without liver problems, or 2000mg /day for those with liver problems. Return: Return to the ED for worsening symptoms.

## 2018-06-01 LAB — HIV ANTIBODY (ROUTINE TESTING W REFLEX): HIV Screen 4th Generation wRfx: NONREACTIVE

## 2018-06-01 LAB — GC/CHLAMYDIA PROBE AMP (~~LOC~~) NOT AT ARMC
Chlamydia: NEGATIVE
Neisseria Gonorrhea: NEGATIVE

## 2018-06-02 LAB — RPR, QUANT+TP ABS (REFLEX)
Rapid Plasma Reagin, Quant: 1:2 {titer} — ABNORMAL HIGH
T Pallidum Abs: NONREACTIVE

## 2018-06-02 LAB — RPR: RPR Ser Ql: REACTIVE — AB

## 2019-08-27 IMAGING — CT CT ABD-PELV W/ CM
2 of 4 series · 17 of 46 positions shown, 19 images · IV contrast (iopamidol)
Comparison: July 22, 2016

CLINICAL DATA: Right abdomen pain with nausea

EXAM:
CT ABDOMEN AND PELVIS WITH CONTRAST
TECHNIQUE: Multidetector CT imaging of the abdomen and pelvis was performed
using the standard protocol following bolus administration of
intravenous contrast.
CONTRAST:  100mL P9LFWN-TJJ IOPAMIDOL (P9LFWN-TJJ) INJECTION 61%

[Series 2: axial st · axial · 0.71mm/px · z∈[-492,-78]mm · 14 of 95 slices shown, 16 images]
[im 6/95  soft-tissue]
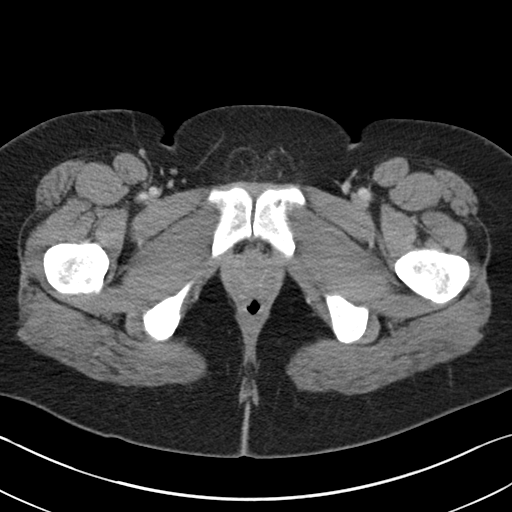
[im 6/95  bone]
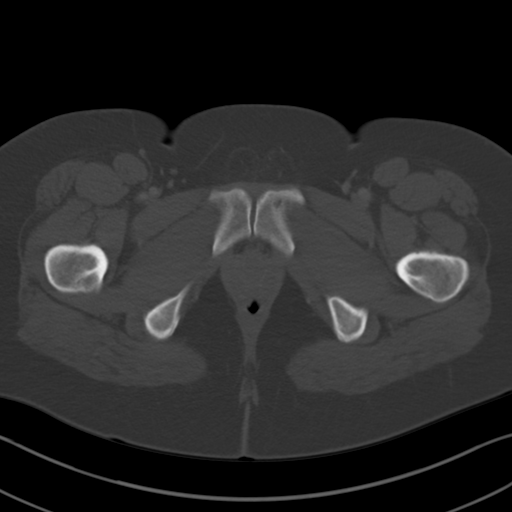
[im 11/95  soft-tissue]
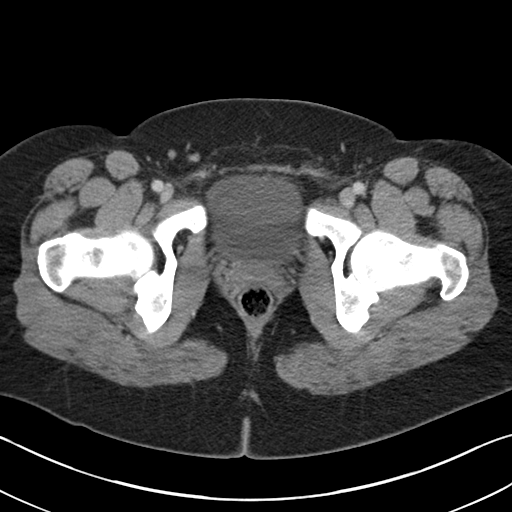
[im 21/95  soft-tissue]
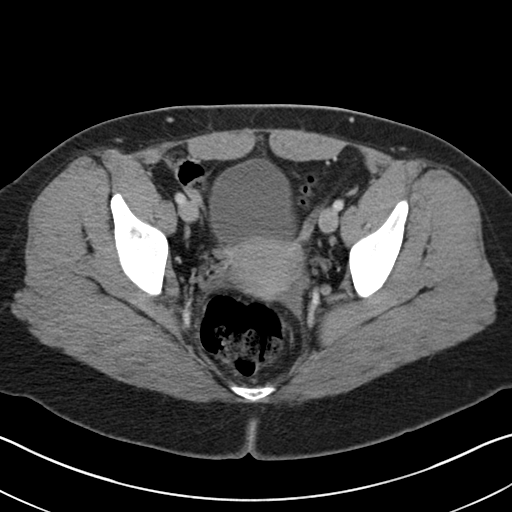
[im 27/95  soft-tissue]
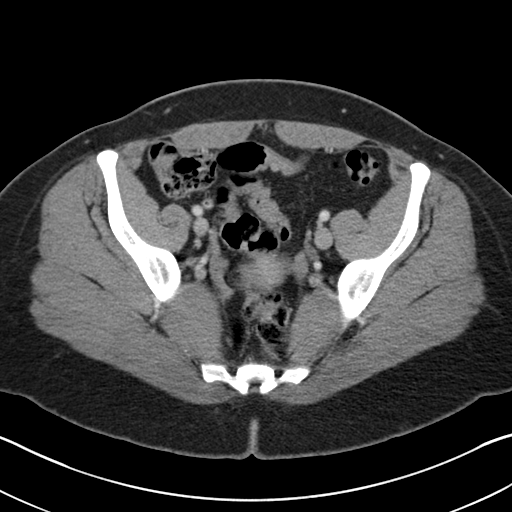
[im 32/95  soft-tissue]
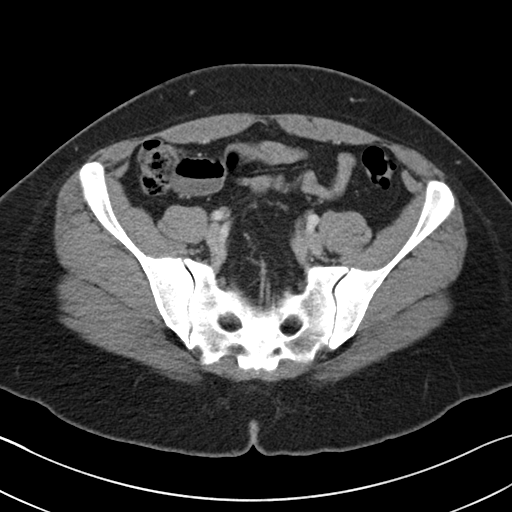
[im 37/95  soft-tissue]
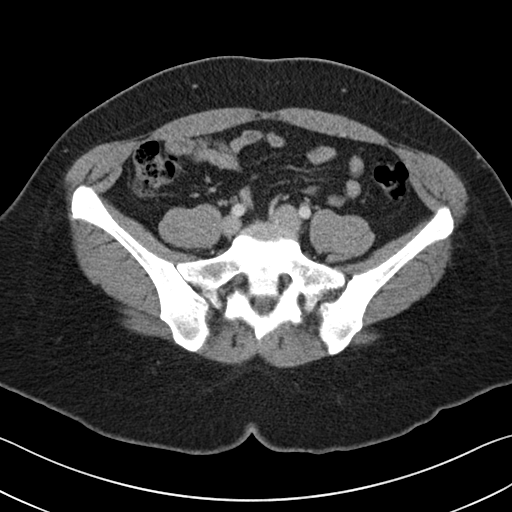
[im 42/95  soft-tissue]
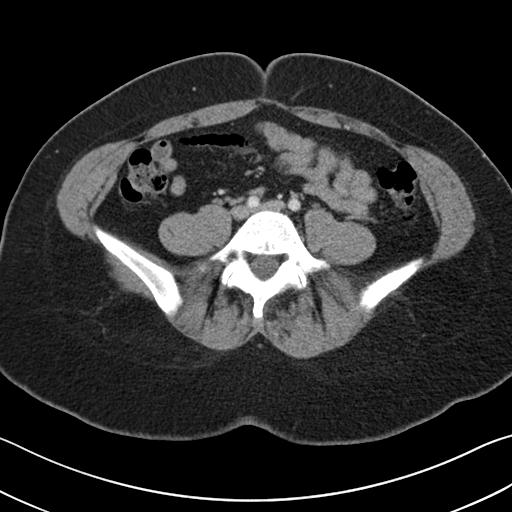
[im 53/95  soft-tissue]
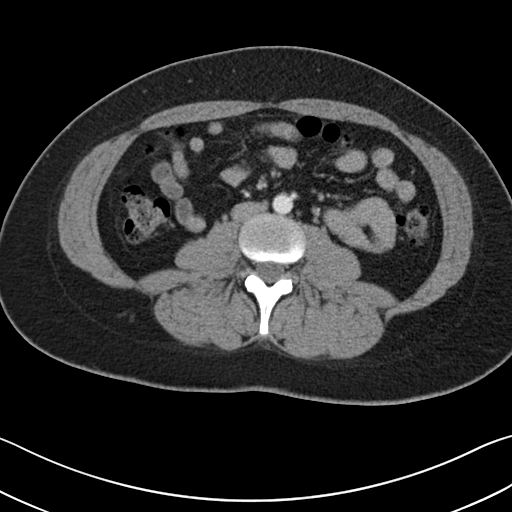
[im 58/95  soft-tissue]
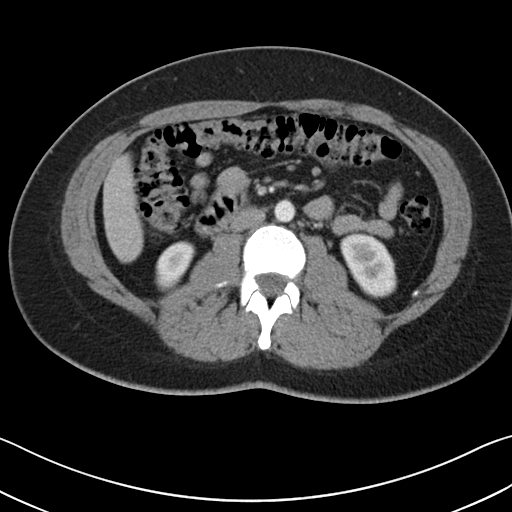
[im 58/95  bone]
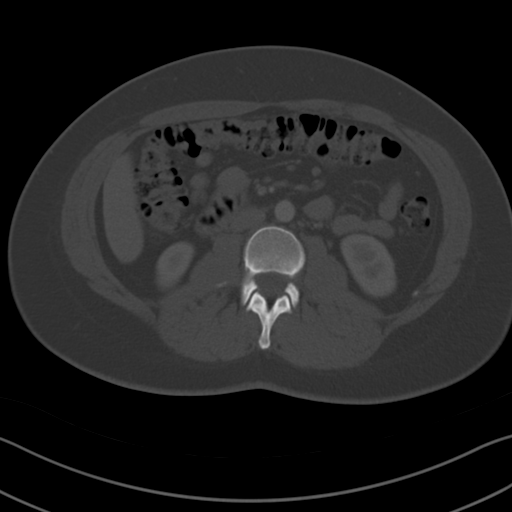
[im 63/95  soft-tissue]
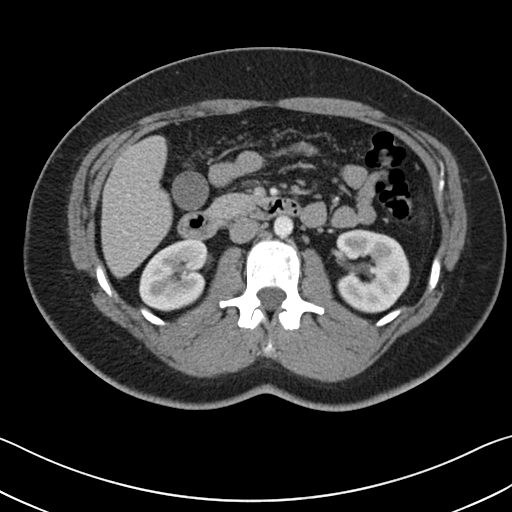
[im 68/95  soft-tissue]
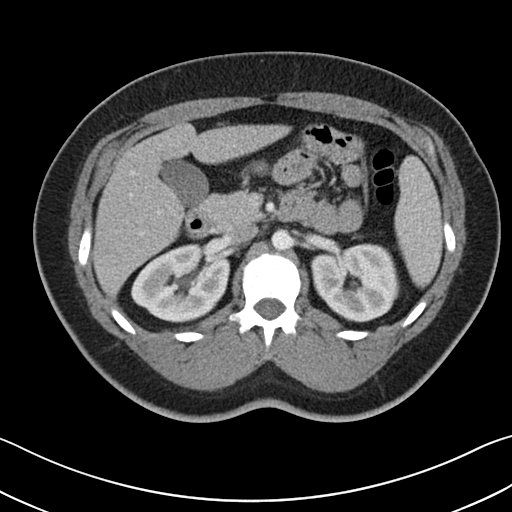
[im 74/95  soft-tissue]
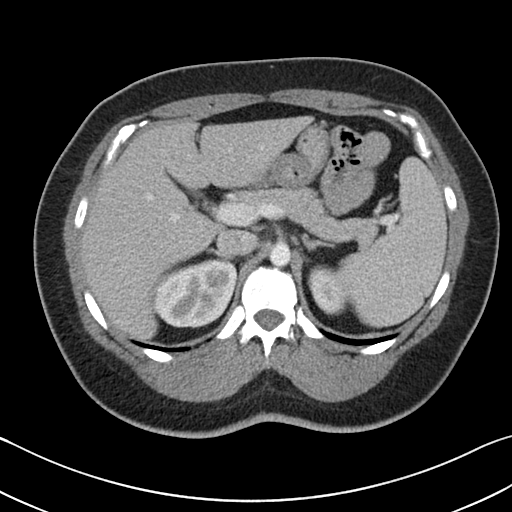
[im 84/95  soft-tissue]
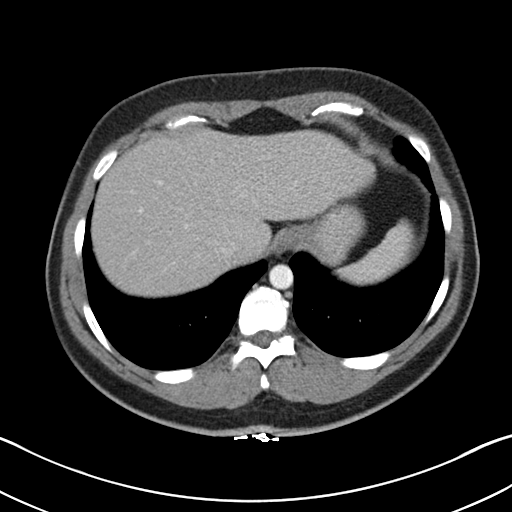
[im 89/95  soft-tissue]
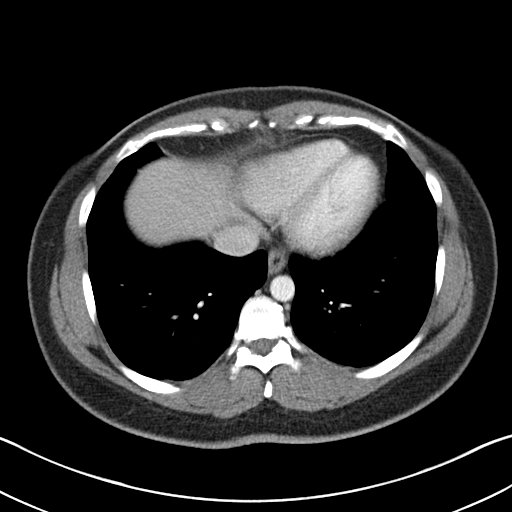

[Series 5: coronal st · coronal · 0.85mm/px · 3 of 91 slices shown]
[im 31/91  soft-tissue]
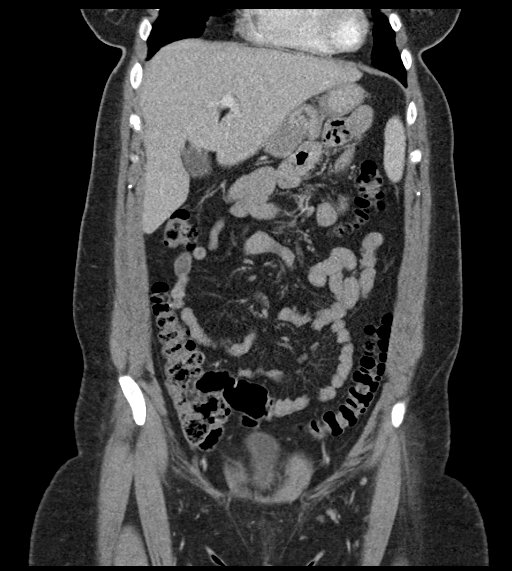
[im 41/91  soft-tissue]
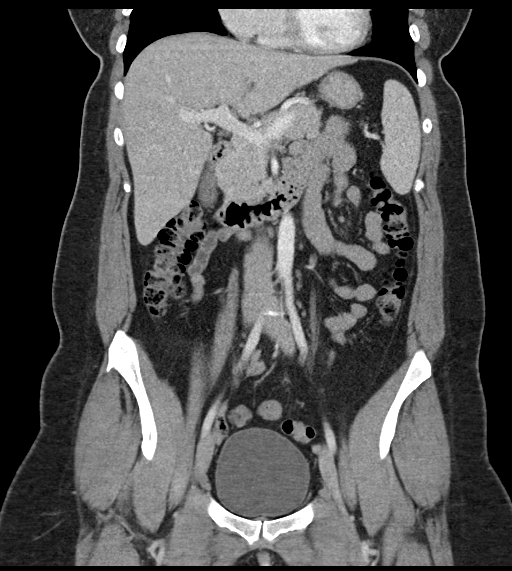
[im 51/91  soft-tissue]
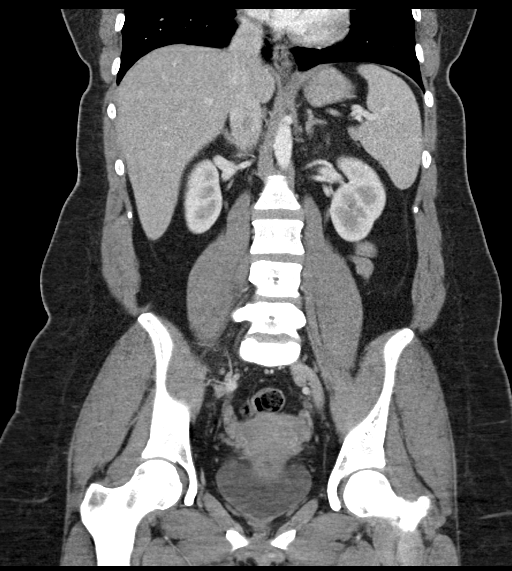

[17 of 46 positions shown; findings below may reference images not displayed]

FINDINGS: Lower chest: No acute abnormality.

Hepatobiliary: Liver is prominent measuring 18.2 cm in length
unchanged. No focal liver lesion is identified. Gallbladder is
normal. The biliary tree is normal.

Pancreas: Unremarkable. No pancreatic ductal dilatation or
surrounding inflammatory changes.

Spleen: Normal in size without focal abnormality.

Adrenals/Urinary Tract: Adrenal glands are unremarkable. Kidneys are
normal, without renal calculi, focal lesion, or hydronephrosis.
Bladder is unremarkable.

Stomach/Bowel: Stomach is within normal limits. Appendix appears
normal. No evidence of bowel wall thickening, distention, or
inflammatory changes.

Vascular/Lymphatic: No significant vascular findings are present. No
enlarged abdominal or pelvic lymph nodes.

Reproductive: Uterus and bilateral adnexa are unremarkable.

Other: No abdominal wall hernia or abnormality. No abdominopelvic
ascites.

Musculoskeletal: No acute abnormality identified.
IMPRESSION: No acute abnormality identified in the abdomen and pelvis. The
appendix is normal. There is no bowel obstruction.
